# Patient Record
Sex: Male | Born: 1935 | Race: White | Hispanic: No | Marital: Married | Smoking: Former smoker
Health system: Southern US, Community
[De-identification: ages and names within clinical notes are randomized; demographics above are authoritative.]

## PROBLEM LIST (undated history)

## (undated) DIAGNOSIS — G620 Drug-induced polyneuropathy: Secondary | ICD-10-CM

## (undated) DIAGNOSIS — C169 Malignant neoplasm of stomach, unspecified: Secondary | ICD-10-CM

## (undated) DIAGNOSIS — K219 Gastro-esophageal reflux disease without esophagitis: Secondary | ICD-10-CM

## (undated) DIAGNOSIS — C44320 Squamous cell carcinoma of skin of unspecified parts of face: Secondary | ICD-10-CM

## (undated) DIAGNOSIS — F0280 Dementia in other diseases classified elsewhere without behavioral disturbance: Secondary | ICD-10-CM

## (undated) DIAGNOSIS — K635 Polyp of colon: Secondary | ICD-10-CM

## (undated) DIAGNOSIS — N400 Enlarged prostate without lower urinary tract symptoms: Secondary | ICD-10-CM

## (undated) DIAGNOSIS — M159 Polyosteoarthritis, unspecified: Secondary | ICD-10-CM

## (undated) DIAGNOSIS — G309 Alzheimer's disease, unspecified: Secondary | ICD-10-CM

## (undated) HISTORY — DX: Malignant neoplasm of stomach, unspecified: C16.9

## (undated) HISTORY — DX: Drug-induced polyneuropathy: G62.0

## (undated) HISTORY — DX: Polyp of colon: K63.5

## (undated) HISTORY — DX: Gastro-esophageal reflux disease without esophagitis: K21.9

## (undated) HISTORY — PX: CATARACT EXTRACTION, BILATERAL: SHX1313

## (undated) HISTORY — PX: HEMORRHOID SURGERY: SHX153

## (undated) HISTORY — PX: ROTATOR CUFF REPAIR: SHX139

## (undated) HISTORY — DX: Benign prostatic hyperplasia without lower urinary tract symptoms: N40.0

## (undated) HISTORY — DX: Squamous cell carcinoma of skin of unspecified parts of face: C44.320

## (undated) HISTORY — DX: Polyosteoarthritis, unspecified: M15.9

---

## 1997-10-15 DIAGNOSIS — C169 Malignant neoplasm of stomach, unspecified: Secondary | ICD-10-CM

## 1997-10-15 HISTORY — PX: PARTIAL GASTRECTOMY: SHX2172

## 1997-10-15 HISTORY — DX: Malignant neoplasm of stomach, unspecified: C16.9

## 2011-05-22 ENCOUNTER — Encounter: Payer: Self-pay | Admitting: Internal Medicine

## 2011-05-22 ENCOUNTER — Ambulatory Visit (INDEPENDENT_AMBULATORY_CARE_PROVIDER_SITE_OTHER): Payer: Medicare Other | Admitting: Internal Medicine

## 2011-05-22 DIAGNOSIS — G620 Drug-induced polyneuropathy: Secondary | ICD-10-CM

## 2011-05-22 DIAGNOSIS — D126 Benign neoplasm of colon, unspecified: Secondary | ICD-10-CM

## 2011-05-22 DIAGNOSIS — C169 Malignant neoplasm of stomach, unspecified: Secondary | ICD-10-CM

## 2011-05-22 DIAGNOSIS — R269 Unspecified abnormalities of gait and mobility: Secondary | ICD-10-CM

## 2011-05-22 DIAGNOSIS — K219 Gastro-esophageal reflux disease without esophagitis: Secondary | ICD-10-CM

## 2011-05-22 DIAGNOSIS — M159 Polyosteoarthritis, unspecified: Secondary | ICD-10-CM

## 2011-05-22 DIAGNOSIS — K635 Polyp of colon: Secondary | ICD-10-CM

## 2011-05-22 LAB — CBC WITH DIFFERENTIAL/PLATELET
Basophils Absolute: 0 10*3/uL (ref 0.0–0.1)
Eosinophils Absolute: 0.2 10*3/uL (ref 0.0–0.7)
HCT: 41.8 % (ref 39.0–52.0)
Hemoglobin: 13.9 g/dL (ref 13.0–17.0)
Lymphs Abs: 2.1 10*3/uL (ref 0.7–4.0)
MCHC: 33.3 g/dL (ref 30.0–36.0)
MCV: 91.9 fl (ref 78.0–100.0)
Monocytes Absolute: 0.9 10*3/uL (ref 0.1–1.0)
Neutro Abs: 2.9 10*3/uL (ref 1.4–7.7)
Platelets: 148 10*3/uL — ABNORMAL LOW (ref 150.0–400.0)
RDW: 14.2 % (ref 11.5–14.6)

## 2011-05-22 LAB — TSH: TSH: 0.96 u[IU]/mL (ref 0.35–5.50)

## 2011-05-22 LAB — HEPATIC FUNCTION PANEL
Albumin: 3.7 g/dL (ref 3.5–5.2)
Alkaline Phosphatase: 81 U/L (ref 39–117)
Total Protein: 7.3 g/dL (ref 6.0–8.3)

## 2011-05-22 LAB — BASIC METABOLIC PANEL
BUN: 26 mg/dL — ABNORMAL HIGH (ref 6–23)
CO2: 28 mEq/L (ref 19–32)
GFR: 69.24 mL/min (ref >60.00)
Glucose, Bld: 78 mg/dL (ref 70–99)
Potassium: 4.9 mEq/L (ref 3.5–5.1)
Sodium: 141 mEq/L (ref 135–145)

## 2011-05-22 MED ORDER — SUCRALFATE 1 GM/10ML PO SUSP: 1.0000 g | Freq: Two times a day (BID) | ORAL | Status: DC

## 2011-05-22 NOTE — Assessment & Plan Note (Signed)
Mostly in shoulders Only painful if he has to work above his head No meds

## 2011-05-22 NOTE — Progress Notes (Signed)
Subjective:    Patient ID: Corey Clark, male    DOB: [birth year], 77 y.o.   MRN: [id number]  HPI New resident at [facility] --moved from [place] several weeks ago Establishing here  Has imbalance problem since stomach cancer surgery ~13 years ago Got chemo then also Has to be careful when moving --doesn't feel dizzy though Has some numbness in feet Has had occ fall---last was on broken step at granddaughter's house No vertigo  Has polyps in colon Last colonoscopy 2010  Takes carafate for some time (a couple of years) Doesn't seem to have active reflux now Had indigestion which the carafate resolved  Has chronic shoulder pain Still with soreness Rest of body mostly okay  No current outpatient prescriptions on file prior to visit.    Allergies  Allergen Reactions  . Tetracyclines & Related Other (See Comments)    Patient not sure it's been years since he's taken med    Past Medical History  Diagnosis Date  . GERD (gastroesophageal reflux disease)   . Colon polyps   . Osteoarthrosis involving, or with mention of more than one site, but not specified as generalized, multiple sites   . Neuropathy due to drug     chemo for stomach cancer  . Stomach cancer 1999    surgery then chemo. Bad infection after with 1 month hospitalization    Past Surgical History  Procedure Date  . Partial gastrectomy 1999    2 procedures for stomach cancer  . Rotator cuff repair 1991/1998    left and right done  . Hemorrhoid surgery 1998 & 2000  . Cataract extraction, bilateral 2002, 2003    Family History  Problem Relation Age of Onset  . Cancer Brother   . Heart disease Neg Hx   . Hypertension Neg Hx   . Diabetes Neg Hx     History   Social History  . Marital Status: Married    Spouse Name: N/A    Number of Children: 3  . Years of Education: N/A   Occupational History  . 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 Smoking  status: Former Smoker    Quit date: 10/15/1950  . Smokeless tobacco: Never Used  . Alcohol Use: Yes     rare wine  . Drug Use: No  . Sexually Active: Not on file   Other Topics Concern  . Not on file   Social History Narrative   [place], then son, Jayde Daffin., to be health care POAWould accept resuscitation attemptsNot sure about tube feeds   Review of Systems  Constitutional: Negative for fatigue and unexpected weight change.       Wears seat belt  HENT: Positive for hearing loss and tinnitus.        Hearing aide on right Own teeth, regular with dentist  Eyes: Negative for visual disturbance.       Vision is good  Respiratory: Negative for cough, chest tightness and shortness of breath.   Cardiovascular: Negative for chest pain, palpitations and leg swelling.  Gastrointestinal: Negative for nausea, vomiting, constipation and blood in stool.       No stomach trouble on the carafate  Genitourinary: Negative for dysuria, frequency and difficulty urinating.  Musculoskeletal: Positive for arthralgias. Negative for back pain and joint swelling.       Chronic shoulder pain  Skin:  Sees dermatologist for occ lesions (?actinics)  Neurological: Positive for numbness. Negative for syncope and weakness.  Psychiatric/Behavioral: Negative for sleep disturbance and dysphoric mood. The patient is not nervous/anxious.        Objective:   Physical Exam  Constitutional: He appears well-developed and well-nourished. No distress.  HENT:  Mouth/Throat: Oropharynx is clear and moist. No oropharyngeal exudate.  Eyes: Conjunctivae and EOM are normal. Pupils are equal, round, and reactive to light.  Neck: Normal range of motion. Neck supple. No thyromegaly present.  Cardiovascular: Normal rate, regular rhythm, normal heart sounds and intact distal pulses.  Exam reveals no gallop.   No murmur heard. Pulmonary/Chest: Effort normal. No respiratory  distress. He has no wheezes. He has no rales.  Abdominal: Soft. There is no tenderness.  Musculoskeletal: Normal range of motion. He exhibits no edema and no tenderness.  Lymphadenopathy:    He has no cervical adenopathy.  Neurological:       Mild decreased fine touch sensation R>L plantar feet  Skin: No rash noted.       No suspicious lesions  Psychiatric: He has a normal mood and affect. His behavior is normal. Judgment and thought content normal.          Assessment & Plan:

## 2011-05-22 NOTE — Assessment & Plan Note (Signed)
Well controlled on the carafate  Will continue

## 2011-05-22 NOTE — Assessment & Plan Note (Signed)
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

## 2011-05-22 NOTE — Assessment & Plan Note (Signed)
Treated 13 years ago No evidence of recurrence apparently

## 2011-05-22 NOTE — Assessment & Plan Note (Signed)
Will check records

## 2011-05-22 NOTE — Patient Instructions (Signed)
Please get records from [facility] doctor for past 3 years

## 2011-06-01 ENCOUNTER — Encounter: Payer: Self-pay | Admitting: Internal Medicine

## 2011-10-26 DIAGNOSIS — J060 Acute laryngopharyngitis: Secondary | ICD-10-CM | POA: Diagnosis not present

## 2011-10-26 DIAGNOSIS — B351 Tinea unguium: Secondary | ICD-10-CM | POA: Diagnosis not present

## 2011-10-26 DIAGNOSIS — M79609 Pain in unspecified limb: Secondary | ICD-10-CM | POA: Diagnosis not present

## 2011-10-26 DIAGNOSIS — R1310 Dysphagia, unspecified: Secondary | ICD-10-CM | POA: Diagnosis not present

## 2011-11-23 ENCOUNTER — Encounter: Payer: Self-pay | Admitting: Internal Medicine

## 2011-11-26 ENCOUNTER — Ambulatory Visit (INDEPENDENT_AMBULATORY_CARE_PROVIDER_SITE_OTHER): Payer: Medicare Other | Admitting: Internal Medicine

## 2011-11-26 ENCOUNTER — Encounter: Payer: Self-pay | Admitting: Internal Medicine

## 2011-11-26 DIAGNOSIS — T50904A Poisoning by unspecified drugs, medicaments and biological substances, undetermined, initial encounter: Secondary | ICD-10-CM

## 2011-11-26 DIAGNOSIS — K219 Gastro-esophageal reflux disease without esophagitis: Secondary | ICD-10-CM | POA: Diagnosis not present

## 2011-11-26 DIAGNOSIS — M159 Polyosteoarthritis, unspecified: Secondary | ICD-10-CM

## 2011-11-26 DIAGNOSIS — G620 Drug-induced polyneuropathy: Secondary | ICD-10-CM

## 2011-11-26 DIAGNOSIS — K5909 Other constipation: Secondary | ICD-10-CM

## 2011-11-26 NOTE — Progress Notes (Signed)
Subjective:    Patient ID: Corey Clark, male    DOB: 10-Nov-1924, 78 y.o.   MRN: [id number]  HPI Here with wife No new concerns  Wonders about repeating colonoscopy Intermittent gas, indigestion and constipation Still on the carafate bid Stool will get hard---may skip a day. Hasn't used meds for this No blood in stools No nausea or vomiting  Appetite is fine Weight is stable  No sugar free candy or drinks regularly Does eat dairy---no prior problems with this  Still with numbness in toes Walks okay --still notes chronic imbalance Discussed using walking stick  No sig arthritis problems--except shoulders Fine if he limits above shoulder work Still doesn't need meds  Current Outpatient Prescriptions on File Prior to Visit  Medication Sig Dispense Refill  . Multiple Vitamin (MULTIVITAMIN) tablet Take 1 tablet by mouth daily.        . sucralfate (CARAFATE) 1 GM/10ML suspension Take 10 mLs (1 g total) by mouth 2 (two) times daily.  1680 mL  3    Allergies  Allergen Reactions  . Tetracyclines & Related Other (See Comments)    Patient not sure it's been years since he's taken med    Past Medical History  Diagnosis Date  . GERD (gastroesophageal reflux disease)   . Colon polyps   . Osteoarthrosis involving, or with mention of more than one site, but not specified as generalized, multiple sites   . Neuropathy due to drug     chemo for stomach cancer  . Stomach cancer 1999    MALT lymphoma --surgery then chemo. Bad infection after with 1 month hospitalization    Past Surgical History  Procedure Date  . Partial gastrectomy 1999    2 procedures for stomach cancer  . Rotator cuff repair 1991/1998    left and right done  . Hemorrhoid surgery 1998 & 2000  . Cataract extraction, bilateral 2002, 2003    Family History  Problem Relation Age of Onset  . Cancer Brother   . Heart disease Neg Hx   . Hypertension Neg Hx   . Diabetes Neg Hx     History   Social  History  . Marital Status: Married    Spouse Name: N/A    Number of Children: 3  . Years of Education: N/A   Occupational History  . 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 Smoking status: Former Smoker    Quit date: 10/15/1950  . Smokeless tobacco: Never Used  . Alcohol Use: Yes     rare wine  . Drug Use: No  . Sexually Active: Not on file   Other Topics Concern  . Not on file   Social History Narrative   3 [place] living [place] Wife, then son, Enoch Moffa., to be health care POAWould accept resuscitation attemptsNot sure about tube feeds   Review of Systems Sleeps okay No chest pain No SOB    Objective:   Physical Exam  Constitutional: He appears well-developed and well-nourished. No distress.  Neck: Normal range of motion. Neck supple.  Cardiovascular: Normal rate, regular rhythm and normal heart sounds.  Exam reveals no gallop.   No murmur heard. Pulmonary/Chest: Effort normal and breath sounds normal. No respiratory distress. He has no wheezes. He has no rales.  Abdominal: Soft. Bowel sounds are normal. He exhibits no distension and no mass. There is no tenderness. There is no rebound and no guarding.  Musculoskeletal: He exhibits no edema and no tenderness.  Lymphadenopathy:    He has no cervical adenopathy.  Psychiatric: He has a normal mood and affect. His behavior is normal. Judgment and thought content normal.          Assessment & Plan:

## 2011-11-26 NOTE — Assessment & Plan Note (Signed)
From chemo Discussed need for cane or walking stick--esp if on non level ground

## 2011-11-26 NOTE — Assessment & Plan Note (Signed)
Not clear that this is cause of his indigestion Discussed meds for this, decreased dairy, etc

## 2011-11-26 NOTE — Patient Instructions (Addendum)
You can try senekot-S 1-2 daily or miralax 17 grams daily if you are constipated You may want to try lactaid milk instead of regular and decrease your cheese for 1-2 weeks to see if that helps the indigestion

## 2011-11-26 NOTE — Assessment & Plan Note (Signed)
Mostly shoulders Does okay without meds

## 2011-11-26 NOTE — Assessment & Plan Note (Signed)
May be part of his symptoms Continue the carafate

## 2011-12-05 DIAGNOSIS — L819 Disorder of pigmentation, unspecified: Secondary | ICD-10-CM | POA: Diagnosis not present

## 2011-12-05 DIAGNOSIS — L570 Actinic keratosis: Secondary | ICD-10-CM | POA: Diagnosis not present

## 2011-12-31 ENCOUNTER — Ambulatory Visit (INDEPENDENT_AMBULATORY_CARE_PROVIDER_SITE_OTHER): Payer: Medicare Other | Admitting: Family Medicine

## 2011-12-31 ENCOUNTER — Encounter: Payer: Self-pay | Admitting: Family Medicine

## 2011-12-31 VITALS — BP 122/62 | HR 76 | Temp 97.5°F | Wt 190.0 lb

## 2011-12-31 DIAGNOSIS — M79609 Pain in unspecified limb: Secondary | ICD-10-CM | POA: Diagnosis not present

## 2011-12-31 DIAGNOSIS — M79645 Pain in left finger(s): Secondary | ICD-10-CM

## 2011-12-31 DIAGNOSIS — M6530 Trigger finger, unspecified finger: Secondary | ICD-10-CM | POA: Insufficient documentation

## 2011-12-31 NOTE — Progress Notes (Signed)
  Patient Name: Corey Clark Date of Birth: March 27, 1925 Age: 78 y.o. Medical Record Number: [id number] Gender: male Date of Encounter: 12/31/2011  History of Present Illness:  Corey Clark is a 78 y.o. very pleasant male patient who presents with the following:  Pleasant gentleman with left-sided hand pain I am asked to see acutely in the office today by Dr. Dayton Martes. He is friendly, and has been having some sticking when he is flexing his left hand mostly on the fourth digit. No trauma or accident. No bruising. No significant swelling, but he does have a painful nodule   Past Medical History, Surgical History, Social History, Family History, Problem List, Medications, and Allergies have been reviewed and updated if relevant.  Review of Systems:  GEN: No fevers, chills. Nontoxic. Primarily MSK c/o today. MSK: Detailed in the HPI GI: tolerating PO intake without difficulty Neuro: No numbness, parasthesias, or tingling associated. Otherwise the pertinent positives of the ROS are noted above.    Physical Examination: Temp 98.6 Pulse 75 RR 18   GEN: WDWN, NAD, Non-toxic, Alert & Oriented x 3 HEENT: Atraumatic, Normocephalic.  Ears and Nose: No external deformity. EXTR: No clubbing/cyanosis/edema NEURO: Normal gait.  PSYCH: Normally interactive. Conversant. Not depressed or anxious appearing.  Calm demeanor.   L hand Ecchymosis or edema: neg ROM wrist/hand/digits: full  Ecchymosis or edema: neg No instability Cysts/nodules: 4th volar Digit triggering: 4th Finkelstein's test: neg Snuffbox tenderness: neg Scaphoid tubercle: NT Resisted supination: NT Full composite fist, no malrotation Grip, all digits: 5/5 str DIPJT: NT PIP JT: NT MCP JT: NT Atrophy: neg  Hand sensation: intact   Assessment and Plan: 1. Finger pain, left   2. Trigger finger, left      Using an anatomical model, I reviewed with the patent the structures involved and how they related to  their diagnosis .  We discussed the pathophysiology of trigger fingers. Discussed the inflammatory nature of nodule creation and likely nodule abutting the A1 pulley system, this causing the patient's discomfort and sensations. We discussed that treatments for this include direct injection into the tendon sheath to attempt to shrink catching tissue. This can be done 1-2 times. Other treatments include surgical release. If the patient fails to trigger finger injections, I would recommend trigger finger release if the patient desires relief of the symptoms.  Trigger Finger Injection, L 4th Verbal consent was obtained. Risks (including rare risk of infection, potential risk for skin lightening and potential atrophy), benefits and alternatives were discussed. Prepped with Chloraprep and Ethyl Chloride used for anesthesia. Under sterile conditions, patient injected at palmar crease aiming distally with 45 degree angle towards nodule; injected directly into tendon sheath. Medication flowed freely without resistance.  Needle size: 22 gauge 1 1/2 inch Injection: 1/2 cc of Lidocaine 1% and 1/2 cc of Depo-Medrol 40 mg

## 2011-12-31 NOTE — Progress Notes (Signed)
  Subjective:    Patient ID: Corey Clark, male    DOB: 1925/01/14, 78 y.o.   MRN: [id number]  HPI  78 yo pt of Dr. Alphonsus Sias with h/o OA here for:  Acute onset of pain in left fourth digit last week. Pain is relieved by making a fist or rubbing over palm of his hand. Never had anything like this before.  Can feel a "snap" over palm.    Review of Systems     Objective:   Physical Exam BP 122/62  Pulse 76  Temp(Src) 97.5 F (36.4 C) (Oral)  Wt 190 lb (86.183 kg)      Assessment & Plan:   1. Trigger finger

## 2012-01-25 DIAGNOSIS — M79609 Pain in unspecified limb: Secondary | ICD-10-CM | POA: Diagnosis not present

## 2012-01-25 DIAGNOSIS — B351 Tinea unguium: Secondary | ICD-10-CM | POA: Diagnosis not present

## 2012-02-07 DIAGNOSIS — L570 Actinic keratosis: Secondary | ICD-10-CM | POA: Diagnosis not present

## 2012-02-07 DIAGNOSIS — C44611 Basal cell carcinoma of skin of unspecified upper limb, including shoulder: Secondary | ICD-10-CM | POA: Diagnosis not present

## 2012-02-07 DIAGNOSIS — D485 Neoplasm of uncertain behavior of skin: Secondary | ICD-10-CM | POA: Diagnosis not present

## 2012-02-21 DIAGNOSIS — C44601 Unspecified malignant neoplasm of skin of unspecified upper limb, including shoulder: Secondary | ICD-10-CM | POA: Diagnosis not present

## 2012-02-21 DIAGNOSIS — C44611 Basal cell carcinoma of skin of unspecified upper limb, including shoulder: Secondary | ICD-10-CM | POA: Diagnosis not present

## 2012-04-02 ENCOUNTER — Telehealth: Payer: Self-pay

## 2012-04-02 NOTE — Telephone Encounter (Signed)
Mandy with [facility] said pt had skin CA removal from lt forearm 6 weeks ago. External stitches removed; dissolvable internal stitches are trying to come out of skin at suture line. No drainage or redness noted.Pt has [facility] and would like appt with Dr Alphonsus Sias. Appt scheduled 04/03/12 at 1:45pm after speaking with [facility].

## 2012-04-03 ENCOUNTER — Encounter: Payer: Self-pay | Admitting: Internal Medicine

## 2012-04-03 ENCOUNTER — Ambulatory Visit (INDEPENDENT_AMBULATORY_CARE_PROVIDER_SITE_OTHER): Payer: Medicare Other | Admitting: Internal Medicine

## 2012-04-03 VITALS — BP 120/62 | HR 64 | Temp 97.6°F | Wt 192.0 lb

## 2012-04-03 DIAGNOSIS — L089 Local infection of the skin and subcutaneous tissue, unspecified: Secondary | ICD-10-CM

## 2012-04-03 NOTE — Assessment & Plan Note (Signed)
Surgical site is not infected Some sutures which have not dissolved yet Discussed warm compresses If stitch appears, can try to gently pull out Patience is needed now

## 2012-04-03 NOTE — Progress Notes (Signed)
  Subjective:    Patient ID: Corey Clark, male    DOB: [birth year], 78 y.o.   MRN: [id number]  HPI Saw Dr Purcell Nails for regular check up Suspicious lesion on left forearm biopsied---basal cell Came back for excision May 9th Stitches removed May 17th and steri-strips applied  Still sees some absorbable sutures Slight festering and bumps along suture line No sig pain now---did have some soreness at times  Current Outpatient Prescriptions on File Prior to Visit  Medication Sig Dispense Refill  . Multiple Vitamin (MULTIVITAMIN) tablet Take 1 tablet by mouth daily.        . sucralfate (CARAFATE) 1 GM/10ML suspension Take 10 mLs (1 g total) by mouth 2 (two) times daily.  1680 mL  3    Allergies  Allergen Reactions  . Tetracyclines & Related Other (See Comments)    Patient not sure it's been years since he's taken med    Past Medical History  Diagnosis Date  . GERD (gastroesophageal reflux disease)   . Colon polyps   . Osteoarthrosis involving, or with mention of more than one site, but not specified as generalized, multiple sites   . Neuropathy due to drug     chemo for stomach cancer  . Stomach cancer 1999    MALT lymphoma --surgery then chemo. Bad infection after with 1 month hospitalization    Past Surgical History  Procedure Date  . Partial gastrectomy 1999    2 procedures for stomach cancer  . Rotator cuff repair 1991/1998    left and right done  . Hemorrhoid surgery 1998 & 2000  . Cataract extraction, bilateral 2002, 2003    Family History  Problem Relation Age of Onset  . Cancer Brother   . Heart disease Neg Hx   . Hypertension Neg Hx   . Diabetes Neg Hx     History   Social History  . Marital Status: Married    Spouse Name: N/A    Number of Children: 3  . Years of Education: N/A   Occupational History  . 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 Smoking status: Former Smoker    Quit date: 10/15/1950  .  Smokeless tobacco: Never Used  . Alcohol Use: Yes     rare wine  . Drug Use: No  . Sexually Active: Not on file   Other Topics Concern  . Not on file   Social History Narrative   3 [place] living [place] Wife, then son, Aras Albarran., to be health care POAWould accept resuscitation attemptsNot sure about tube feeds   Review of Systems No fever Feels okay    Objective:   Physical Exam  Constitutional: He appears well-developed and well-nourished. No distress.  Skin:       Left forearm excision spot is well healed Mild erythema without warmth or significant tenderness along site Several firm spots along scar which seem like undissolved sutures          Assessment & Plan:

## 2012-04-25 DIAGNOSIS — L600 Ingrowing nail: Secondary | ICD-10-CM | POA: Diagnosis not present

## 2012-04-25 DIAGNOSIS — B351 Tinea unguium: Secondary | ICD-10-CM | POA: Diagnosis not present

## 2012-04-30 DIAGNOSIS — L719 Rosacea, unspecified: Secondary | ICD-10-CM | POA: Diagnosis not present

## 2012-05-30 ENCOUNTER — Encounter: Payer: Self-pay | Admitting: Internal Medicine

## 2012-05-30 ENCOUNTER — Ambulatory Visit (INDEPENDENT_AMBULATORY_CARE_PROVIDER_SITE_OTHER): Payer: Medicare Other | Admitting: Internal Medicine

## 2012-05-30 VITALS — BP 112/60 | HR 58 | Temp 98.1°F | Ht 70.0 in | Wt 190.0 lb

## 2012-05-30 DIAGNOSIS — T50904A Poisoning by unspecified drugs, medicaments and biological substances, undetermined, initial encounter: Secondary | ICD-10-CM | POA: Diagnosis not present

## 2012-05-30 DIAGNOSIS — M159 Polyosteoarthritis, unspecified: Secondary | ICD-10-CM | POA: Diagnosis not present

## 2012-05-30 DIAGNOSIS — Z0000 Encounter for general adult medical examination without abnormal findings: Secondary | ICD-10-CM | POA: Diagnosis not present

## 2012-05-30 DIAGNOSIS — G620 Drug-induced polyneuropathy: Secondary | ICD-10-CM | POA: Diagnosis not present

## 2012-05-30 DIAGNOSIS — R413 Other amnesia: Secondary | ICD-10-CM

## 2012-05-30 DIAGNOSIS — K219 Gastro-esophageal reflux disease without esophagitis: Secondary | ICD-10-CM

## 2012-05-30 LAB — HEPATIC FUNCTION PANEL
ALT: 16 U/L (ref 0–53)
AST: 34 U/L (ref 0–37)
Alkaline Phosphatase: 73 U/L (ref 39–117)
Bilirubin, Direct: 0.1 mg/dL (ref 0.0–0.3)
Total Protein: 7.3 g/dL (ref 6.0–8.3)

## 2012-05-30 LAB — CBC WITH DIFFERENTIAL/PLATELET
Basophils Relative: 0.6 % (ref 0.0–3.0)
Eosinophils Absolute: 0.2 10*3/uL (ref 0.0–0.7)
Eosinophils Relative: 2.9 % (ref 0.0–5.0)
Hemoglobin: 14 g/dL (ref 13.0–17.0)
Lymphocytes Relative: 39.3 % (ref 12.0–46.0)
MCHC: 33.1 g/dL (ref 30.0–36.0)
Neutro Abs: 2.5 10*3/uL (ref 1.4–7.7)
RBC: 4.58 Mil/uL (ref 4.22–5.81)

## 2012-05-30 LAB — SEDIMENTATION RATE: Sed Rate: 19 mm/hr (ref 0–22)

## 2012-05-30 LAB — BASIC METABOLIC PANEL
CO2: 29 mEq/L (ref 19–32)
Calcium: 8.9 mg/dL (ref 8.4–10.5)
Chloride: 104 mEq/L (ref 96–112)
Sodium: 139 mEq/L (ref 135–145)

## 2012-05-30 NOTE — Assessment & Plan Note (Signed)
I have personally reviewed the Medicare Annual Wellness questionnaire and have noted 1. The patient's medical and social history 2. Their use of alcohol, tobacco or illicit drugs 3. Their current medications and supplements 4. The patient's functional ability including ADL's, fall risks, home safety risks and hearing or visual             impairment. 5. Diet and physical activities 6. Evidence for depression or mood disorders  The patients weight, height, BMI and visual acuity have been recorded in the chart I have made referrals, counseling and provided education to the patient based review of the above and I have provided the pt with a written personalized care plan for preventive services.  I have provided you with a copy of your personalized plan for preventive services. Please take the time to review along with your updated medication list.  Cognitive issues are only concern--will follow up on this

## 2012-05-30 NOTE — Assessment & Plan Note (Signed)
Pattern most consistent with MCI but will do Montreal test at follow up Check labs Wife worried about NPH but he doesn't have ataxia or incontinence

## 2012-05-30 NOTE — Progress Notes (Signed)
Subjective:    Patient ID: Corey Clark, male    DOB: 1925-04-16, 78 y.o.   MRN: [id number]  HPI Here for Medicare Wellness visit and check up Arm lesion did heal up---may still have undissolved stitch there Reviewed advanced directives Had fall when going into building for movie--tripped on curb. Hit face and had slight skinned area on nose. He has chronic imbalance problems Occ down day (if wife is out) but no anhedonia UTD on immunizations Had colonoscopy and PSA not appropriate Mild memory problems  Stomach has been fine No heartburn issues Continues on the sucralfate  Balance problems conitinue Discussed cane Toes are still numb Wife heard about association of this and NPH---not clear about that  No major arthritis problems CMC pain on left, some shoulder problems No meds  Current Outpatient Prescriptions on File Prior to Visit  Medication Sig Dispense Refill  . Multiple Vitamin (MULTIVITAMIN) tablet Take 1 tablet by mouth daily.        . sucralfate (CARAFATE) 1 GM/10ML suspension Take 10 mLs (1 g total) by mouth 2 (two) times daily.  1680 mL  3    Allergies  Allergen Reactions  . Tetracyclines & Related Other (See Comments)    Patient not sure it's been years since he's taken med    Past Medical History  Diagnosis Date  . GERD (gastroesophageal reflux disease)   . Colon polyps   . Osteoarthrosis involving, or with mention of more than one site, but not specified as generalized, multiple sites   . Neuropathy due to drug     chemo for stomach cancer  . Stomach cancer 1999    MALT lymphoma --surgery then chemo. Bad infection after with 1 month hospitalization    Past Surgical History  Procedure Date  . Partial gastrectomy 1999    2 procedures for stomach cancer  . Rotator cuff repair 1991/1998    left and right done  . Hemorrhoid surgery 1998 & 2000  . Cataract extraction, bilateral 2002, 2003    Family History  Problem Relation Age of Onset  .  Cancer Brother   . Heart disease Neg Hx   . Hypertension Neg Hx   . Diabetes Neg Hx     History   Social History  . Marital Status: Married    Spouse Name: N/A    Number of Children: 3  . Years of Education: N/A   Occupational History  . 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 Smoking status: Former Smoker    Quit date: 10/15/1950  . Smokeless tobacco: Never Used  . Alcohol Use: Yes     rare wine  . Drug Use: No  . Sexually Active: Not on file   Other Topics Concern  . Not on file   Social History Narrative   3 [place] living [place] Wife, then son, Noa Constante., to be health care POAWould accept resuscitation attemptsNot sure about tube feeds   Review of Systems Sleeps fine---occ takes nap after eating Appetite is good Weight stable     Objective:   Physical Exam  Constitutional: He is oriented to person, place, and time. He appears well-developed and well-nourished. No distress.  Neck: Normal range of motion. Neck supple. No thyromegaly present.  Cardiovascular: Normal rate, regular rhythm and normal heart sounds.  Exam reveals no gallop.   No murmur heard. Pulmonary/Chest: Effort normal and breath sounds normal. No respiratory distress.  He has no wheezes. He has no rales.  Abdominal: Soft. There is no tenderness.  Musculoskeletal: He exhibits no edema and no tenderness.  Lymphadenopathy:    He has no cervical adenopathy.  Neurological: He is alert and oriented to person, place, and time.       President-- "Barack Obama, ?" [phone] [id number] Recall 3/3  Gait fairly normal Romberg absent  Psychiatric: He has a normal mood and affect. His behavior is normal. Thought content normal.          Assessment & Plan:

## 2012-05-30 NOTE — Assessment & Plan Note (Signed)
Controlled with the carafate

## 2012-05-30 NOTE — Assessment & Plan Note (Signed)
Discussed using cane

## 2012-05-30 NOTE — Assessment & Plan Note (Signed)
Mild symptoms Generally doesn't take meds 

## 2012-06-02 LAB — VITAMIN B12: Vitamin B-12: 363 pg/mL (ref 211–911)

## 2012-06-03 ENCOUNTER — Encounter: Payer: Self-pay | Admitting: *Deleted

## 2012-06-30 ENCOUNTER — Encounter: Payer: Self-pay | Admitting: Internal Medicine

## 2012-06-30 ENCOUNTER — Ambulatory Visit (INDEPENDENT_AMBULATORY_CARE_PROVIDER_SITE_OTHER): Payer: Medicare Other | Admitting: Internal Medicine

## 2012-06-30 VITALS — BP 120/68 | HR 64 | Wt 187.0 lb

## 2012-06-30 DIAGNOSIS — G3184 Mild cognitive impairment, so stated: Secondary | ICD-10-CM

## 2012-06-30 DIAGNOSIS — E875 Hyperkalemia: Secondary | ICD-10-CM

## 2012-06-30 NOTE — Assessment & Plan Note (Signed)
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

## 2012-06-30 NOTE — Progress Notes (Signed)
  Subjective:    Patient ID: Corey Clark, male    DOB: Oct 08, 1925, 78 y.o.   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 METROGEL 1 % gel Apply 1 application topically daily.       . Multiple Vitamin (MULTIVITAMIN) tablet Take 1 tablet by mouth daily.        . sucralfate (CARAFATE) 1 GM/10ML suspension Take 10 mLs (1 g total) by mouth 2 (two) times daily.  1680 mL  3  . Sulfacetamide Sodium-Sulfur 10-5 % EMUL Apply 1 application topically daily.         Allergies  Allergen Reactions  . Tetracyclines & Related Other (See Comments)    Patient not sure it's been years since he's taken med    Past Medical History  Diagnosis Date  . GERD (gastroesophageal reflux disease)   . Colon polyps   . Osteoarthrosis involving, or with mention of more than one site, but not specified as generalized, multiple sites   . Neuropathy due to drug     chemo for stomach cancer  . Stomach cancer 1999    MALT lymphoma --surgery then chemo. Bad infection after with 1 month hospitalization    Past Surgical History  Procedure Date  . Partial gastrectomy 1999    2 procedures for stomach cancer  . Rotator cuff repair 1991/1998    left and right done  . Hemorrhoid surgery 1998 & 2000  . Cataract extraction, bilateral 2002, 2003    Family History  Problem Relation Age of Onset  . Cancer Brother   . Heart disease Neg Hx   . Hypertension Neg Hx   . Diabetes Neg Hx     History   Social History  . Marital Status: Married    Spouse Name: N/A      Number of Children: 3  . Years of Education: N/A   Occupational History  . 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 Smoking status: Former Smoker    Quit date: 10/15/1950  . Smokeless tobacco: Never Used  . Alcohol Use: Yes     rare wine  . Drug Use: No  . Sexually Active: Not on file   Other Topics Concern  . Not on file   Social History Narrative   [place] Wife, then son, Corey Clark., to be health care POAWould accept resuscitation attemptsNot sure about tube feeds   Review of Systems No incontinence He does the cooking---appetite is fine    Objective:   Physical Exam  Neurological:       See [facility] Cognitive Assessment form          Assessment & Plan:

## 2012-07-01 ENCOUNTER — Ambulatory Visit: Payer: Self-pay | Admitting: Internal Medicine

## 2012-07-01 ENCOUNTER — Encounter: Payer: Self-pay | Admitting: *Deleted

## 2012-07-01 DIAGNOSIS — G3184 Mild cognitive impairment, so stated: Secondary | ICD-10-CM | POA: Diagnosis not present

## 2012-07-01 DIAGNOSIS — R413 Other amnesia: Secondary | ICD-10-CM | POA: Diagnosis not present

## 2012-07-02 ENCOUNTER — Encounter: Payer: Self-pay | Admitting: Internal Medicine

## 2012-07-02 DIAGNOSIS — L821 Other seborrheic keratosis: Secondary | ICD-10-CM | POA: Diagnosis not present

## 2012-07-02 DIAGNOSIS — L719 Rosacea, unspecified: Secondary | ICD-10-CM | POA: Diagnosis not present

## 2012-07-02 DIAGNOSIS — L819 Disorder of pigmentation, unspecified: Secondary | ICD-10-CM | POA: Diagnosis not present

## 2012-07-02 DIAGNOSIS — Z85828 Personal history of other malignant neoplasm of skin: Secondary | ICD-10-CM | POA: Diagnosis not present

## 2012-07-02 DIAGNOSIS — L820 Inflamed seborrheic keratosis: Secondary | ICD-10-CM | POA: Diagnosis not present

## 2012-07-02 DIAGNOSIS — L570 Actinic keratosis: Secondary | ICD-10-CM | POA: Diagnosis not present

## 2012-07-03 ENCOUNTER — Telehealth: Payer: Self-pay

## 2012-07-03 NOTE — Telephone Encounter (Signed)
No restriction on bananas  If they want to try a medication for the mild cognitive impairment or early dementia, can send Rx for donepezil 5mg  daily #30 x 3 Set up appt in about 2 months to review how he is doing on it Most common problem is stomach or intestinal issues--can stop if any trouble with it

## 2012-07-03 NOTE — Telephone Encounter (Signed)
Pt's wife left v/m is there a med that pt can take for early stage of dementia? Also how often can pt eat bananas(important to pt). Pt's wife did not leave pharmacy info and I left v/m for pt to call back.

## 2012-07-04 MED ORDER — DONEPEZIL HCL 5 MG PO TABS: 5.0000 mg | ORAL_TABLET | Freq: Every evening | ORAL | Status: DC | PRN

## 2012-07-04 NOTE — Telephone Encounter (Signed)
.  left message to have patient return my call.  

## 2012-07-04 NOTE — Telephone Encounter (Signed)
pts wife left v/m returning call; call back [phone].

## 2012-07-04 NOTE — Telephone Encounter (Signed)
Spoke with patient's wife and advised results rx sent to pharmacy by e-script  

## 2012-07-11 DIAGNOSIS — B351 Tinea unguium: Secondary | ICD-10-CM | POA: Diagnosis not present

## 2012-07-11 DIAGNOSIS — M79609 Pain in unspecified limb: Secondary | ICD-10-CM | POA: Diagnosis not present

## 2012-07-14 ENCOUNTER — Other Ambulatory Visit: Payer: Self-pay | Admitting: Internal Medicine

## 2012-07-14 ENCOUNTER — Other Ambulatory Visit: Payer: Self-pay | Admitting: *Deleted

## 2012-07-14 MED ORDER — SUCRALFATE 1 GM/10ML PO SUSP: 1.0000 g | Freq: Two times a day (BID) | ORAL | Status: DC

## 2012-08-12 DIAGNOSIS — Z23 Encounter for immunization: Secondary | ICD-10-CM | POA: Diagnosis not present

## 2012-09-08 ENCOUNTER — Encounter: Payer: Self-pay | Admitting: Internal Medicine

## 2012-09-08 ENCOUNTER — Ambulatory Visit (INDEPENDENT_AMBULATORY_CARE_PROVIDER_SITE_OTHER): Payer: Medicare Other | Admitting: Internal Medicine

## 2012-09-08 VITALS — BP 102/60 | HR 67 | Temp 98.2°F | Wt 186.0 lb

## 2012-09-08 DIAGNOSIS — G3184 Mild cognitive impairment, so stated: Secondary | ICD-10-CM

## 2012-09-08 MED ORDER — DONEPEZIL HCL 10 MG PO TABS: 10.0000 mg | ORAL_TABLET | Freq: Every evening | ORAL | Status: DC | PRN

## 2012-09-08 NOTE — Assessment & Plan Note (Signed)
Seems to have had at least a mild response to the donepezil No side effects Will increase to 10mg 

## 2012-09-08 NOTE — Progress Notes (Signed)
  Subjective:    Patient ID: Corey Clark, male    DOB: June 15, 1925, 78 y.o.   MRN: [id number]  HPI Here with wife  Has been on the donepezil for about 2 months He doesn't note much difference Wife notes that he doesn't ask the same things over and over as much Wife also has slate and she puts the daily schedule on it  Wife worries about depression Overall, not as upset about his dementia issues as wife is but ?some mood issues He doesn't endorse ongoing mood issues---just reactive things (Iike upset till poorly installed shelves were fixed) Not anhedonic---games, trips, walking  No apparent side effects with the med No stomach upset Bowels and appetite are fine  Current Outpatient Prescriptions on File Prior to Visit  Medication Sig Dispense Refill  . donepezil (ARICEPT) 5 MG tablet Take 1 tablet (5 mg total) by mouth at bedtime as needed.  30 tablet  3  . Multiple Vitamin (MULTIVITAMIN) tablet Take 1 tablet by mouth daily.        . sucralfate (CARAFATE) 1 GM/10ML suspension Take 10 mLs (1 g total) by mouth 2 (two) times daily.  1680 mL  3    Allergies  Allergen Reactions  . Tetracyclines & Related Other (See Comments)    Patient not sure it's been years since he's taken med    Past Medical History  Diagnosis Date  . GERD (gastroesophageal reflux disease)   . Colon polyps   . Osteoarthrosis involving, or with mention of more than one site, but not specified as generalized, multiple sites   . Neuropathy due to drug     chemo for stomach cancer  . Stomach cancer 1999    MALT lymphoma --surgery then chemo. Bad infection after with 1 month hospitalization    Past Surgical History  Procedure Date  . Partial gastrectomy 1999    2 procedures for stomach cancer  . Rotator cuff repair 1991/1998    left and right done  . Hemorrhoid surgery 1998 & 2000  . Cataract extraction, bilateral 2002, 2003    Family History  Problem Relation Age of Onset  . Cancer Brother     . Heart disease Neg Hx   . Hypertension Neg Hx   . Diabetes Neg Hx     History   Social History  . Marital Status: Married    Spouse Name: N/A    Number of Children: 3  . Years of Education: N/A   Occupational History  . 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 Smoking status: Former Smoker    Quit date: 10/15/1950  . Smokeless tobacco: Never Used  . Alcohol Use: Yes     Comment: rare wine  . Drug Use: No  . Sexually Active: Not on file   Other Topics Concern  . Not on file   Social History Narrative   3 [place] living [place] Wife, then son, Celester Lech., to be health care POAWould accept resuscitation attemptsNot sure about tube feeds   Review of Systems Has noted some drippy nose at times Sleeps fine--but awakens early    Objective:   Physical Exam  Constitutional: He appears well-developed and well-nourished. No distress.  Psychiatric: He has a normal mood and affect. His behavior is normal.          Assessment & Plan:

## 2012-09-19 DIAGNOSIS — M79609 Pain in unspecified limb: Secondary | ICD-10-CM | POA: Diagnosis not present

## 2012-09-19 DIAGNOSIS — B351 Tinea unguium: Secondary | ICD-10-CM | POA: Diagnosis not present

## 2012-10-15 DIAGNOSIS — C44320 Squamous cell carcinoma of skin of unspecified parts of face: Secondary | ICD-10-CM

## 2012-10-15 HISTORY — DX: Squamous cell carcinoma of skin of unspecified parts of face: C44.320

## 2012-10-16 DIAGNOSIS — H43399 Other vitreous opacities, unspecified eye: Secondary | ICD-10-CM | POA: Diagnosis not present

## 2012-12-12 DIAGNOSIS — B351 Tinea unguium: Secondary | ICD-10-CM | POA: Diagnosis not present

## 2013-01-02 ENCOUNTER — Encounter: Payer: Self-pay | Admitting: Internal Medicine

## 2013-01-02 ENCOUNTER — Ambulatory Visit (INDEPENDENT_AMBULATORY_CARE_PROVIDER_SITE_OTHER): Payer: Medicare Other | Admitting: Internal Medicine

## 2013-01-02 VITALS — BP 122/70 | HR 68 | Temp 97.5°F | Wt 183.0 lb

## 2013-01-02 DIAGNOSIS — N400 Enlarged prostate without lower urinary tract symptoms: Secondary | ICD-10-CM | POA: Diagnosis not present

## 2013-01-02 DIAGNOSIS — J3489 Other specified disorders of nose and nasal sinuses: Secondary | ICD-10-CM | POA: Diagnosis not present

## 2013-01-02 DIAGNOSIS — G3184 Mild cognitive impairment, so stated: Secondary | ICD-10-CM | POA: Diagnosis not present

## 2013-01-02 NOTE — Assessment & Plan Note (Signed)
Clear improvement on the donepezil Will continue

## 2013-01-02 NOTE — Assessment & Plan Note (Signed)
Not clearly allergic Will try antihistamines If not better--will try ipratropium spray

## 2013-01-02 NOTE — Progress Notes (Signed)
Subjective:    Patient ID: Corey Clark, male    DOB: 08-16-1925, 79 y.o.   MRN: [id number]  HPI Here with wife  Concerned about runny nose and dripping Goes back for some months Not sick Not aware of primary allegies Tried OTC med without success  Notes difficulty with urine Slow and dribbling Nocturia x 1-2 Some urgency but no incontinence  Has been on the higher dose of donepezil He is better per wife--clear improvement on this  Current Outpatient Prescriptions on File Prior to Visit  Medication Sig Dispense Refill  . donepezil (ARICEPT) 10 MG tablet Take 1 tablet (10 mg total) by mouth at bedtime as needed.  90 tablet  3  . sucralfate (CARAFATE) 1 GM/10ML suspension Take 10 mLs (1 g total) by mouth 2 (two) times daily.  1680 mL  3   No current facility-administered medications on file prior to visit.    Allergies  Allergen Reactions  . Tetracyclines & Related Other (See Comments)    Patient not sure it's been years since he's taken med    Past Medical History  Diagnosis Date  . GERD (gastroesophageal reflux disease)   . Colon polyps   . Osteoarthrosis involving, or with mention of more than one site, but not specified as generalized, multiple sites   . Neuropathy due to drug     chemo for stomach cancer  . Stomach cancer 1999    MALT lymphoma --surgery then chemo. Bad infection after with 1 month hospitalization  . BPH (benign prostatic hypertrophy)     Past Surgical History  Procedure Laterality Date  . Partial gastrectomy  1999    2 procedures for stomach cancer  . Rotator cuff repair  1991/1998    left and right done  . Hemorrhoid surgery  1998 & 2000  . Cataract extraction, bilateral  2002, 2003    Family History  Problem Relation Age of Onset  . Cancer Brother   . Heart disease Neg Hx   . Hypertension Neg Hx   . Diabetes Neg Hx     History   Social History  . Marital Status: Married    Spouse Name: N/A    Number of Children: 3  .  Years of Education: N/A   Occupational History  . 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 Smoking status: Former Smoker    Quit date: 10/15/1950  . Smokeless tobacco: Never Used  . Alcohol Use: Yes     Comment: rare wine  . Drug Use: No  . Sexually Active: Not on file   Other Topics Concern  . Not on file   Social History Narrative   3 children in [place]   Has living will    Wife, then son, Tadarius Maland., to be health care POA   Would accept resuscitation attempts   Not sure about tube feeds   Review of Systems Sleeps okay Appetite is good Weight is up 3# Not particularly active     Objective:   Physical Exam  Constitutional: He appears well-developed and well-nourished. No distress.  Neck: Normal range of motion. Neck supple. No thyromegaly present.  Cardiovascular: Normal rate, regular rhythm and normal heart sounds.  Exam reveals no gallop.   No murmur heard. Pulmonary/Chest: Effort normal and breath sounds normal. No respiratory distress. He has no wheezes. He has no rales.  Abdominal: Soft. He exhibits no mass. There is no tenderness.  No suprapubic dullness  Musculoskeletal: He exhibits no edema and no tenderness.  Lymphadenopathy:    He has no cervical adenopathy.  Psychiatric: He has a normal mood and affect. His behavior is normal.          Assessment & Plan:

## 2013-01-02 NOTE — Assessment & Plan Note (Signed)
Mild symptoms He is not ready for meds yet Would try tamsulosin if he worsens

## 2013-01-02 NOTE — Patient Instructions (Addendum)
Please try cetirizine 10mg  daily or fexofenadine 180mg  daily for the runny nose. If that doesn't work, let me know and I can prescribe a spray for your nose.  If your urinary symptoms are more troubling, I can start you on medication for this.

## 2013-01-19 ENCOUNTER — Ambulatory Visit (INDEPENDENT_AMBULATORY_CARE_PROVIDER_SITE_OTHER): Payer: Medicare Other | Admitting: Family Medicine

## 2013-01-19 ENCOUNTER — Encounter: Payer: Self-pay | Admitting: Family Medicine

## 2013-01-19 VITALS — BP 140/72 | HR 76 | Temp 97.5°F | Ht 70.0 in | Wt 184.8 lb

## 2013-01-19 DIAGNOSIS — M6530 Trigger finger, unspecified finger: Secondary | ICD-10-CM

## 2013-01-19 NOTE — Progress Notes (Signed)
[facility] conservation officer at [facility] [address] [place] [address] Phone: [phone] Fax: [phone]  Date:  01/19/2013   Name:  Corey Clark   DOB:  1925/09/03   MRN:  [id number] Gender: male Age: 79 y.o.  Primary Physician:  Tillman Abide, MD  Evaluating MD: Hannah Beat, MD   Chief Complaint: Hand Pain   History of Present Illness:  Corey Clark is a 79 y.o. pleasant patient who presents with the following:  L 4th trigger finger:  Patient Active Problem List  Diagnosis  . GERD (gastroesophageal reflux disease)  . Colon polyps  . Osteoarthritis, multiple sites  . Neuropathy due to drug  . Stomach cancer  . Other constipation  . Trigger finger  . Routine general medical examination at a health care facility  . Mild cognitive impairment  . BPH (benign prostatic hypertrophy)  . Rhinorrhea    Past Medical History  Diagnosis Date  . GERD (gastroesophageal reflux disease)   . Colon polyps   . Osteoarthrosis involving, or with mention of more than one site, but not specified as generalized, multiple sites   . Neuropathy due to drug     chemo for stomach cancer  . Stomach cancer 1999    MALT lymphoma --surgery then chemo. Bad infection after with 1 month hospitalization  . BPH (benign prostatic hypertrophy)     Past Surgical History  Procedure Laterality Date  . Partial gastrectomy  1999    2 procedures for stomach cancer  . Rotator cuff repair  1991/1998    left and right done  . Hemorrhoid surgery  1998 & 2000  . Cataract extraction, bilateral  2002, 2003    History   Social History  . Marital Status: Married    Spouse Name: N/A    Number of Children: 3  . Years of Education: N/A   Occupational History  . 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 Smoking status: Former Smoker    Quit date: 10/15/1950  . Smokeless tobacco: Never Used  . Alcohol Use: Yes     Comment: rare wine  .  Drug Use: No  . Sexually Active: Not on file   Other Topics Concern  . Not on file   Social History Narrative   3 children in [place]   Has living will    Wife, then son, Corey Bucklew., to be health care POA   Would accept resuscitation attempts   Not sure about tube feeds    Family History  Problem Relation Age of Onset  . Cancer Brother   . Heart disease Neg Hx   . Hypertension Neg Hx   . Diabetes Neg Hx     Allergies  Allergen Reactions  . Tetracyclines & Related Other (See Comments)    Patient not sure it's been years since he's taken med    Medication list has been reviewed and updated.  Outpatient Prescriptions Prior to Visit  Medication Sig Dispense Refill  . donepezil (ARICEPT) 10 MG tablet Take 1 tablet (10 mg total) by mouth at bedtime as needed.  90 tablet  3  . sucralfate (CARAFATE) 1 GM/10ML suspension Take 10 mLs (1 g total) by mouth 2 (two) times daily.  1680 mL  3   No facility-administered medications prior to visit.    Review of Systems:   GEN: No fevers, chills. Nontoxic. Primarily MSK c/o today. MSK: Detailed in  the HPI GI: tolerating PO intake without difficulty Neuro: No numbness, parasthesias, or tingling associated. Otherwise the pertinent positives of the ROS are noted above.    Physical Examination: BP 140/72  Pulse 76  Temp(Src) 97.5 F (36.4 C) (Oral)  Ht 5\' 10"  (1.778 m)  Wt 184 lb 12 oz (83.802 kg)  BMI 26.51 kg/m2  SpO2 98%  Ideal Body Weight: Weight in (lb) to have BMI = 25: 173.9   GEN: WDWN, NAD, Non-toxic, Alert & Oriented x 3 HEENT: Atraumatic, Normocephalic.  Ears and Nose: No external deformity. EXTR: No clubbing/cyanosis/edema NEURO: Normal gait.  PSYCH: Normally interactive. Conversant. Not depressed or anxious appearing.  Calm demeanor.   L hand Ecchymosis or edema: neg ROM wrist/hand/digits: full  Carpals, MCP's, digits: NT Distal Ulna and Radius: NT Ecchymosis or edema: neg No  instability Cysts/nodules: flexor nodule Digit triggering: 4th Finkelstein's test: neg Snuffbox tenderness: neg Scaphoid tubercle: NT Resisted supination: NT Full composite fist, no malrotation Grip, all digits: 5/5 str DIPJT: NT PIP JT: NT MCP JT: NT Atrophy: neg  Hand sensation: intact   Assessment and Plan: Trigger finger, acquired   Trigger Finger Injection, L 4th Verbal consent was obtained. Risks (including rare risk of infection, potential risk for skin lightening and potential atrophy), benefits and alternatives were discussed. Prepped with Chloraprep and Ethyl Chloride used for anesthesia. Under sterile conditions, patient injected at palmar crease aiming distally with 45 degree angle towards nodule; injected directly into tendon sheath. Medication flowed freely without resistance.  Needle size: 22 gauge 1 1/2 inch Injection: 1/2 cc of Lidocaine 1% and 1/2 cc of Depo-Medrol 40 mg   Signed, Journei Thomassen T. Fiorella Hanahan, MD 01/19/2013 9:47 AM

## 2013-01-29 ENCOUNTER — Telehealth: Payer: Self-pay

## 2013-01-29 NOTE — Telephone Encounter (Signed)
Since 01/02/13 office visit pt has been taking Cetirizine 10 mg daily and that is not helping runny nose. Pt request Ipratropium spray sent to [facility]. Pt wants to wait for Dr Karle Starch return to have med sent in.Please advise.

## 2013-01-30 NOTE — Telephone Encounter (Signed)
Okay to send Rx for ipratropium 0.3% 2 sprays each nostril daily to bid #1 x 11

## 2013-02-02 MED ORDER — IPRATROPIUM BROMIDE 0.03 % NA SOLN: 2.0000 | Freq: Two times a day (BID) | NASAL | Status: DC

## 2013-02-02 NOTE — Telephone Encounter (Signed)
rx sent to pharmacy and patient advised. 

## 2013-02-20 DIAGNOSIS — B351 Tinea unguium: Secondary | ICD-10-CM | POA: Diagnosis not present

## 2013-02-20 DIAGNOSIS — L723 Sebaceous cyst: Secondary | ICD-10-CM | POA: Diagnosis not present

## 2013-02-20 DIAGNOSIS — M79609 Pain in unspecified limb: Secondary | ICD-10-CM | POA: Diagnosis not present

## 2013-02-20 DIAGNOSIS — L280 Lichen simplex chronicus: Secondary | ICD-10-CM | POA: Diagnosis not present

## 2013-02-20 DIAGNOSIS — Z85828 Personal history of other malignant neoplasm of skin: Secondary | ICD-10-CM | POA: Diagnosis not present

## 2013-02-20 DIAGNOSIS — L570 Actinic keratosis: Secondary | ICD-10-CM | POA: Diagnosis not present

## 2013-03-02 ENCOUNTER — Ambulatory Visit: Payer: Medicare Other | Admitting: Internal Medicine

## 2013-04-16 DIAGNOSIS — C44320 Squamous cell carcinoma of skin of unspecified parts of face: Secondary | ICD-10-CM | POA: Diagnosis not present

## 2013-04-16 DIAGNOSIS — D485 Neoplasm of uncertain behavior of skin: Secondary | ICD-10-CM | POA: Diagnosis not present

## 2013-04-16 DIAGNOSIS — L821 Other seborrheic keratosis: Secondary | ICD-10-CM | POA: Diagnosis not present

## 2013-04-16 DIAGNOSIS — L820 Inflamed seborrheic keratosis: Secondary | ICD-10-CM | POA: Diagnosis not present

## 2013-04-16 DIAGNOSIS — L570 Actinic keratosis: Secondary | ICD-10-CM | POA: Diagnosis not present

## 2013-04-16 DIAGNOSIS — Z85828 Personal history of other malignant neoplasm of skin: Secondary | ICD-10-CM | POA: Diagnosis not present

## 2013-05-15 DIAGNOSIS — M79609 Pain in unspecified limb: Secondary | ICD-10-CM | POA: Diagnosis not present

## 2013-05-15 DIAGNOSIS — B351 Tinea unguium: Secondary | ICD-10-CM | POA: Diagnosis not present

## 2013-06-27 DIAGNOSIS — T1490XA Injury, unspecified, initial encounter: Secondary | ICD-10-CM | POA: Diagnosis not present

## 2013-06-27 DIAGNOSIS — R040 Epistaxis: Secondary | ICD-10-CM | POA: Diagnosis not present

## 2013-06-29 DIAGNOSIS — C44320 Squamous cell carcinoma of skin of unspecified parts of face: Secondary | ICD-10-CM | POA: Diagnosis not present

## 2013-06-29 DIAGNOSIS — Z85828 Personal history of other malignant neoplasm of skin: Secondary | ICD-10-CM | POA: Insufficient documentation

## 2013-07-07 ENCOUNTER — Encounter: Payer: Self-pay | Admitting: Internal Medicine

## 2013-07-07 ENCOUNTER — Ambulatory Visit (INDEPENDENT_AMBULATORY_CARE_PROVIDER_SITE_OTHER): Payer: Medicare Other | Admitting: Internal Medicine

## 2013-07-07 VITALS — BP 138/70 | HR 69 | Wt 184.0 lb

## 2013-07-07 DIAGNOSIS — K219 Gastro-esophageal reflux disease without esophagitis: Secondary | ICD-10-CM

## 2013-07-07 DIAGNOSIS — N400 Enlarged prostate without lower urinary tract symptoms: Secondary | ICD-10-CM | POA: Diagnosis not present

## 2013-07-07 DIAGNOSIS — G3184 Mild cognitive impairment, so stated: Secondary | ICD-10-CM | POA: Diagnosis not present

## 2013-07-07 DIAGNOSIS — Z23 Encounter for immunization: Secondary | ICD-10-CM | POA: Diagnosis not present

## 2013-07-07 DIAGNOSIS — J3489 Other specified disorders of nose and nasal sinuses: Secondary | ICD-10-CM | POA: Diagnosis not present

## 2013-07-07 MED ORDER — FLUTICASONE PROPIONATE 50 MCG/ACT NA SUSP: 2.0000 | Freq: Every day | NASAL | Status: DC

## 2013-07-07 MED ORDER — OMEPRAZOLE 20 MG PO CPDR: 20.0000 mg | DELAYED_RELEASE_CAPSULE | Freq: Every day | ORAL | Status: DC

## 2013-07-07 NOTE — Patient Instructions (Signed)
Please set up an appointment with Dr Patsy Lager to inject your left thumb

## 2013-07-07 NOTE — Assessment & Plan Note (Signed)
Now with mild dysphagia Will add omeprazole

## 2013-07-07 NOTE — Progress Notes (Signed)
Subjective:    Patient ID: Corey Clark, male    DOB: January 13, 1925, 79 y.o.   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 at [facility] Did well with this  Larey Seat off bed recently-- 10 days ago Hit nose and bled Urgent care visit at [facility]---x-ray negative Slight cut at philtrum Had some bruising but now improved Never really hurt--except some neck pain (from ?whiplash)  Still with rhinorrhea Ipratropium and OTC meds were not particularly effective Constantly having to wipe his nose (clear fluid) Feels post nasal drip also Now with some sense of blockage with swallowing or for gas to come up for belch No heartburn on the carafate  Wife notes no changes in mild memory problems No functional decline  No sig arthritis pain Except for left thumb Prior finger pain resolved with cortisone shot  Urine stream is still slow Nocturia x 1 only  Current Outpatient Prescriptions on File Prior to Visit  Medication Sig Dispense Refill  . donepezil (ARICEPT) 10 MG tablet Take 1 tablet (10 mg total) by mouth at bedtime as needed.  90 tablet  3  . ipratropium (ATROVENT) 0.03 % nasal spray Place 2 sprays into the nose every 12 (twelve) hours.  30 mL  11  . sucralfate (CARAFATE) 1 GM/10ML suspension Take 10 mLs (1 g total) by mouth 2 (two) times daily.  1680 mL  3   No current facility-administered medications on file prior to visit.    Allergies  Allergen Reactions  . Tetracyclines & Related Other (See Comments)    Patient not sure it's been years since he's taken med    Past Medical History  Diagnosis Date  . GERD (gastroesophageal reflux disease)   . Colon polyps   . Osteoarthrosis involving, or with mention of more than one site, but not specified as generalized, multiple sites   . Neuropathy due to drug     chemo for stomach cancer  . Stomach cancer 1999    MALT lymphoma --surgery then chemo. Bad infection after with 1 month  hospitalization  . BPH (benign prostatic hypertrophy)   . 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 Partial gastrectomy  1999    2 procedures for stomach cancer  . Rotator cuff repair  1991/1998    left and right done  . Hemorrhoid surgery  1998 & 2000  . Cataract extraction, bilateral  2002, 2003    Family History  Problem Relation Age of Onset  . Cancer Brother   . Heart disease Neg Hx   . Hypertension Neg Hx   . Diabetes Neg Hx     History   Social History  . Marital Status: Married    Spouse Name: N/A    Number of Children: 3  . Years of Education: N/A   Occupational History  . 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 Smoking status: Former Smoker    Quit date: 10/15/1950  . Smokeless tobacco: Never Used  . Alcohol Use: Yes     Comment: rare wine  . Drug Use: No  . Sexual Activity: Not on file   Other Topics Concern  . Not on file   Social History Narrative   3 children in [place]   Has living will    Wife, then son, Machi  Montez Hageman., to be health care POA   Would accept resuscitation attempts   Not sure about tube feeds   Review of Systems Good appetite  Weight stable Sleeps welll Has red itchy spot on end of penis     Objective:   Physical Exam  Constitutional: He appears well-developed and well-nourished. No distress.  Neck: Normal range of motion. Neck supple. No thyromegaly present.  Cardiovascular: Normal rate, regular rhythm, normal heart sounds and intact distal pulses.  Exam reveals no gallop.   No murmur heard. Pulmonary/Chest: Effort normal and breath sounds normal. No respiratory distress. He has no wheezes. He has no rales.  Abdominal: Soft. There is no tenderness.  Genitourinary:  Non specific reddened areas at foreskin and glans  Musculoskeletal: He exhibits no edema and no tenderness.  Lymphadenopathy:     He has no cervical adenopathy.  Psychiatric: He has a normal mood and affect. His behavior is normal.          Assessment & Plan:

## 2013-07-07 NOTE — Assessment & Plan Note (Signed)
Will try fluticasone spray now Consider ENT

## 2013-07-07 NOTE — Assessment & Plan Note (Signed)
Still mild No Rx needed Will try [employer] powder to keep dry

## 2013-07-07 NOTE — Addendum Note (Signed)
Addended by: Sueanne Margarita on: 07/07/2013 03:17 PM   Modules accepted: Orders

## 2013-07-07 NOTE — Assessment & Plan Note (Signed)
Stable on the donepezil No changes

## 2013-07-08 ENCOUNTER — Ambulatory Visit: Payer: Medicare Other | Admitting: Family Medicine

## 2013-07-13 ENCOUNTER — Encounter: Payer: Self-pay | Admitting: Internal Medicine

## 2013-07-13 ENCOUNTER — Ambulatory Visit (INDEPENDENT_AMBULATORY_CARE_PROVIDER_SITE_OTHER): Payer: Medicare Other | Admitting: Family Medicine

## 2013-07-13 ENCOUNTER — Encounter: Payer: Self-pay | Admitting: Family Medicine

## 2013-07-13 VITALS — BP 94/52 | HR 72 | Temp 97.3°F | Ht 70.0 in | Wt 182.5 lb

## 2013-07-13 DIAGNOSIS — M19032 Primary osteoarthritis, left wrist: Secondary | ICD-10-CM

## 2013-07-13 DIAGNOSIS — M19049 Primary osteoarthritis, unspecified hand: Secondary | ICD-10-CM | POA: Insufficient documentation

## 2013-07-13 DIAGNOSIS — M6530 Trigger finger, unspecified finger: Secondary | ICD-10-CM

## 2013-07-13 DIAGNOSIS — M65342 Trigger finger, left ring finger: Secondary | ICD-10-CM

## 2013-07-13 NOTE — Progress Notes (Signed)
Nature conservation officer at [facility] [address] [place] [address] Phone: [phone] Fax: [phone]  Date:  07/13/2013   Name:  Corey Clark   DOB:  [birth year]   MRN:  [id number] Gender: male Age: 79 y.o.  Primary Physician:  Tillman Abide, MD  Evaluating MD: Hannah Beat, MD   Chief Complaint: Left Thumb Injection   History of Present Illness:  Corey Clark is a 79 y.o. pleasant patient who presents with the following:  L CMC OA: longstanding cmc pain, no triggering of the 1st on the left. No old trauma or fractures.   4th trigger finger: trigger finger has returned with some catching and pain.   Patient Active Problem List   Diagnosis Date Noted  . Rhinorrhea 01/02/2013  . BPH (benign prostatic hypertrophy)   . Mild cognitive impairment 06/30/2012  . Routine general medical examination at a health care facility 05/30/2012  . Trigger finger 12/31/2011  . Other constipation 11/26/2011  . GERD (gastroesophageal reflux disease)   . Colon polyps   . Osteoarthritis, multiple sites   . Neuropathy due to drug   . Stomach cancer     Past Medical History  Diagnosis Date  . GERD (gastroesophageal reflux disease)   . Colon polyps   . Osteoarthrosis involving, or with mention of more than one site, but not specified as generalized, multiple sites   . Neuropathy due to drug     chemo for stomach cancer  . Stomach cancer 1999    MALT lymphoma --surgery then chemo. Bad infection after with 1 month hospitalization  . BPH (benign prostatic hypertrophy)   . 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 Partial gastrectomy  1999    2 procedures for stomach cancer  . Rotator cuff repair  1991/1998    left and right done  . Hemorrhoid surgery  1998 & 2000  . Cataract extraction, bilateral  2002, 2003    History   Social History  . Marital Status: Married   Spouse Name: N/A    Number of Children: 3  . Years of Education: N/A   Occupational History  . 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 Smoking status: Former Smoker    Quit date: 10/15/1950  . Smokeless tobacco: Never Used  . Alcohol Use: Yes     Comment: rare wine  . Drug Use: No  . Sexual Activity: Not on file   Other Topics Concern  . Not on file   Social History Narrative   3 children in [place]   Has living will    Wife, then son, Yassine Brunsman., to be health care POA   Would accept resuscitation attempts   Not sure about tube feeds    Family History  Problem Relation Age of Onset  . Cancer Brother   . Heart disease Neg Hx   . Hypertension Neg Hx   . Diabetes Neg Hx     Allergies  Allergen Reactions  . Tetracyclines & Related Other (See Comments)    Patient not sure it's been years since he's taken med    Medication list has been reviewed and updated.  Outpatient Prescriptions Prior to Visit  Medication Sig Dispense Refill  . donepezil (ARICEPT) 10 MG tablet Take 1 tablet (10 mg total) by mouth at bedtime as needed.  90 tablet  3  . fluticasone (FLONASE) 50 MCG/ACT nasal spray Place 2 sprays into the nose daily. In each nostril  16 g  12  . omeprazole (PRILOSEC) 20 MG capsule Take 1 capsule (20 mg total) by mouth daily.  30 capsule  11  . sucralfate (CARAFATE) 1 GM/10ML suspension Take 10 mLs (1 g total) by mouth 2 (two) times daily.  1680 mL  3   No facility-administered medications prior to visit.    Review of Systems:   GEN: No fevers, chills. Nontoxic. Primarily MSK c/o today. MSK: Detailed in the HPI GI: tolerating PO intake without difficulty Neuro: No numbness, parasthesias, or tingling associated. Otherwise the pertinent positives of the ROS are noted above.    Physical Examination: BP 94/52  Pulse 72  Temp(Src) 97.3 F (36.3 C) (Oral)  Ht 5\' 10"  (1.778 m)  Wt 182 lb 8 oz (82.781 kg)  BMI  26.19 kg/m2  Ideal Body Weight: Weight in (lb) to have BMI = 25: 173.9   GEN: WDWN, NAD, Non-toxic, Alert & Oriented x 3 HEENT: Atraumatic, Normocephalic.  Ears and Nose: No external deformity. EXTR: No clubbing/cyanosis/edema NEURO: Normal gait.  PSYCH: Normally interactive. Conversant. Not depressed or anxious appearing.  Calm demeanor.   L hand Ecchymosis or edema: neg ROM wrist/hand/digits: full  Carpals, MCP's, digits: L 1st CMC joint notably ttp Distal Ulna and Radius: NT Ecchymosis or edema: neg No instability Digit triggering: 4th Finkelstein's test: neg Snuffbox tenderness: neg Scaphoid tubercle: NT Resisted supination: NT Full composite fist, no malrotation Grip, all digits: 5/5 str DIPJT: NT PIP JT: NT MCP JT: NT Atrophy: neg  Hand sensation: intact   Assessment and Plan:  CMC DJD(carpometacarpal degenerative joint disease), localized primary, left  Trigger ring finger, left  >25 minutes spent in face to face time with patient, >50% spent in counselling or coordination of care: mostly spent discussing OA, reviewing hand anatomy. I decided to not inject his [facility] today. Conservative care and I think OA tools will help more than anything. See pi.  4th trigger  Trigger Finger Injection, 4th, LEFT Verbal consent was obtained. Risks (including rare risk of infection, potential risk for skin lightening and potential atrophy), benefits and alternatives were discussed. Prepped with Chloraprep and Ethyl Chloride used for anesthesia. Under sterile conditions, patient injected at palmar crease aiming distally with 45 degree angle towards nodule; injected directly into tendon sheath. Medication flowed freely without resistance.  Needle size: 22 gauge 1 1/2 inch Injection: 1/2 cc of Lidocaine 1% and 1/2 cc of Depo-Medrol 40 mg   Orders Today:  No orders of the defined types were placed in this encounter.    Updated Medication List: (Includes new medications, updates  to list, dose adjustments) No orders of the defined types were placed in this encounter.    Medications Discontinued: There are no discontinued medications.    Signed, Elpidio Galea. Aquilla Shambley, MD 07/13/2013 10:53 AM

## 2013-07-13 NOTE — Patient Instructions (Addendum)
OSTEOARTHRITIS:  For symptomatic relief: Tylenol: 2 tablets up to 3-4 times a day As needed NSAIDS are helpful (avoid in kidney disease and ulcers)  For flares, corticosteroid injections help. Glucosamine and Chondroitin often helpful - will take about 3 months to see if you have an effect. If you do, great, keep them up, if none at that point, no need to take in the future.  Omega-3 fish oils may help, 2 grams daily Ice joints on bad days, 20 min, 2-3 x / day Heat is also OK

## 2013-07-21 ENCOUNTER — Encounter: Payer: Self-pay | Admitting: Internal Medicine

## 2013-07-21 MED ORDER — KETOCONAZOLE 2 % EX CREA: TOPICAL_CREAM | Freq: Two times a day (BID) | CUTANEOUS | Status: DC

## 2013-08-14 ENCOUNTER — Ambulatory Visit: Payer: Self-pay | Admitting: Podiatry

## 2013-08-21 ENCOUNTER — Telehealth: Payer: Self-pay

## 2013-08-21 MED ORDER — TRIAMCINOLONE ACETONIDE 0.1 % EX CREA: 1.0000 "application " | TOPICAL_CREAM | Freq: Two times a day (BID) | CUTANEOUS | Status: DC | PRN

## 2013-08-21 NOTE — Telephone Encounter (Signed)
Spoke with patient and advised results Pt is asking if he should see a dermatologist or urologist? If dermatology he would like a recommendation and male physician if possible.

## 2013-08-21 NOTE — Telephone Encounter (Signed)
Please let him know I sent an Rx for a cream to try on the rash. If not better, we may want a dermatologist to look at it

## 2013-08-21 NOTE — Telephone Encounter (Signed)
Pt left v/m was seen in 06/2013 by Dr Alphonsus Sias; pt said problem with penis is not better, med is not helping. Pt said he is having to void several times during the night.Please advise. [facility] [employer] [facility].Pt request cb.

## 2013-08-22 NOTE — Telephone Encounter (Signed)
Please let him know that Dr Armida Sans across from [facility] or Dr Adolphus Birchwood are both in [place] (not sure who he could get in with sooner)  The rash problem definitely needs a dermatologist, not a urologist

## 2013-08-24 NOTE — Telephone Encounter (Signed)
Left detailed message on home answering machine with results.  

## 2013-08-25 ENCOUNTER — Ambulatory Visit (INDEPENDENT_AMBULATORY_CARE_PROVIDER_SITE_OTHER): Payer: Medicare Other | Admitting: Podiatry

## 2013-08-25 ENCOUNTER — Encounter: Payer: Self-pay | Admitting: Podiatry

## 2013-08-25 VITALS — BP 126/48 | HR 70 | Resp 20 | Ht 71.0 in | Wt 175.0 lb

## 2013-08-25 DIAGNOSIS — L259 Unspecified contact dermatitis, unspecified cause: Secondary | ICD-10-CM

## 2013-08-25 DIAGNOSIS — M2010 Hallux valgus (acquired), unspecified foot: Secondary | ICD-10-CM

## 2013-08-25 DIAGNOSIS — B351 Tinea unguium: Secondary | ICD-10-CM

## 2013-08-25 DIAGNOSIS — M79609 Pain in unspecified limb: Secondary | ICD-10-CM | POA: Diagnosis not present

## 2013-08-25 NOTE — Progress Notes (Signed)
Subjective:     Patient ID: Corey Clark, male   DOB: November 19, 1924, 79 y.o.   MRN: [id number]  HPI patient presents stating my nails and I also have a patch of skin that's discolored on top of my foot and at times my bunion get sore on my left foot   Review of Systems     Objective:   Physical Exam  Nursing note and vitals reviewed. Constitutional: He is oriented to person, place, and time.  Cardiovascular: Intact distal pulses.   Musculoskeletal: Normal range of motion.  Neurological: He is oriented to person, place, and time.  Skin: Skin is warm.   patient has a small red patch on the dorsum of the left foot and some redness around the first metatarsal head left. I noted there to be elongated nails incurvated and tender 1-5 both feet     Assessment:     Dermatitis dorsal left with mild structural bunion deformity noted left and nail disease with mycosis 1-5 both feet with discomfort in the corners    Plan:     Discussed all condition and place patient on Naftin gel advised on wider shoes and debrided nailbeds 1-5 both feet with no iatrogenic bleeding noted

## 2013-08-29 ENCOUNTER — Telehealth: Payer: Self-pay

## 2013-08-29 NOTE — Telephone Encounter (Signed)
Pt has a stiff neck that started yesterday and would like a muscle relaxer per call a nurse. Pt has no fever. Pt has tried OTC medication with no success.   Per MD pt can try Salon [place] Patches if not on a blood thinner and can use warm moist heat.   Call a nurse was informed and states she will relay the message and have pt call PCP on Monday if not any better

## 2013-08-31 ENCOUNTER — Ambulatory Visit (INDEPENDENT_AMBULATORY_CARE_PROVIDER_SITE_OTHER): Payer: Medicare Other | Admitting: Internal Medicine

## 2013-08-31 ENCOUNTER — Encounter: Payer: Self-pay | Admitting: Internal Medicine

## 2013-08-31 ENCOUNTER — Telehealth: Payer: Self-pay | Admitting: Family Medicine

## 2013-08-31 VITALS — BP 110/60 | HR 63 | Wt 178.0 lb

## 2013-08-31 DIAGNOSIS — N400 Enlarged prostate without lower urinary tract symptoms: Secondary | ICD-10-CM

## 2013-08-31 DIAGNOSIS — M436 Torticollis: Secondary | ICD-10-CM | POA: Diagnosis not present

## 2013-08-31 MED ORDER — TAMSULOSIN HCL 0.4 MG PO CAPS: 0.4000 mg | ORAL_CAPSULE | Freq: Every day | ORAL | Status: DC

## 2013-08-31 NOTE — Assessment & Plan Note (Signed)
Really quite severe Can continue heat Will make referral to PT at [facility]

## 2013-08-31 NOTE — Progress Notes (Signed)
Subjective:    Patient ID: Corey Clark, male    DOB: 12-Sep-1925, 79 y.o.   MRN: [id number]  HPI Here with wife  Has noted increasing stiffness in his neck Can flex some but very limited with extension and rotation Makes it difficult to get comfortable at night Really bad for the past 3 days Long standing limitation but now much worse  No recent injury Tried heat and OTC pads--not helping Tylenol no help  Having more urinary problems More daytime frequency Nocturia now 2-3 times  Clearly some worse now May have some sense of incomplete empyting  Current Outpatient Prescriptions on File Prior to Visit  Medication Sig Dispense Refill  . donepezil (ARICEPT) 10 MG tablet Take 1 tablet (10 mg total) by mouth at bedtime as needed.  90 tablet  3  . fluticasone (FLONASE) 50 MCG/ACT nasal spray Place 2 sprays into the nose daily. In each nostril  16 g  12  . omeprazole (PRILOSEC) 20 MG capsule Take 1 capsule (20 mg total) by mouth daily.  30 capsule  11  . sucralfate (CARAFATE) 1 GM/10ML suspension Take 10 mLs (1 g total) by mouth 2 (two) times daily.  1680 mL  3  . triamcinolone cream (KENALOG) 0.1 % Apply 1 application topically 2 (two) times daily as needed.  30 g  0   No current facility-administered medications on file prior to visit.    Allergies  Allergen Reactions  . Tetracyclines & Related Other (See Comments)    Patient not sure it's been years since he's taken med    Past Medical History  Diagnosis Date  . GERD (gastroesophageal reflux disease)   . Colon polyps   . Osteoarthrosis involving, or with mention of more than one site, but not specified as generalized, multiple sites   . Neuropathy due to drug     chemo for stomach cancer  . Stomach cancer 1999    MALT lymphoma --surgery then chemo. Bad infection after with 1 month hospitalization  . BPH (benign prostatic hypertrophy)   . SCC (squamous cell carcinoma), face 2014    left preauricular---[facility]     Past Surgical History  Procedure Laterality Date  . Partial gastrectomy  1999    2 procedures for stomach cancer  . Rotator cuff repair  1991/1998    left and right done  . Hemorrhoid surgery  1998 & 2000  . Cataract extraction, bilateral  2002, 2003    Family History  Problem Relation Age of Onset  . Cancer Brother   . Heart disease Neg Hx   . Hypertension Neg Hx   . Diabetes Neg Hx     History   Social History  . Marital Status: Married    Spouse Name: N/A    Number of Children: 3  . Years of Education: N/A   Occupational History  . 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 Smoking status: Former Smoker    Quit date: 10/15/1950  . Smokeless tobacco: Never Used  . Alcohol Use: Yes     Comment: rare wine  . Drug Use: No  . Sexual Activity: Not on file   Other Topics Concern  . Not on file   Social History Narrative   3 children in [place]   Has living will    Wife, then son, Braxon Suder., to be health care POA   Would accept resuscitation attempts   Not  sure about tube feeds   Review of Systems Rash on penis is better with the prescription Not sick No fever     Objective:   Physical Exam  Constitutional: He appears well-developed and well-nourished. No distress.  Neck:  Very stiff Tightness in trapezii Almost no active ROM in any direction No true tenderness  Abdominal: Soft. There is no tenderness.  No obvious suprapubic dullness          Assessment & Plan:

## 2013-08-31 NOTE — Progress Notes (Signed)
Pre-visit discussion using our clinic review tool. No additional management support is needed unless otherwise documented below in the visit note.  

## 2013-08-31 NOTE — Telephone Encounter (Signed)
[facility] Triage Call Report Triage Record Num: [id number] Operator: Geanie Berlin Patient Name: Corey Clark Call Date & Time: 08/29/2013 12:16:57PM Patient Phone: [phone] PCP: Tillman Abide Patient Gender: Male PCP Fax : [phone] Patient DOB: 12/22/1924 Practice Name: [facility] Reason for Call: Caller: Nancy/Spouse; PCP: Tillman Abide (Family Practice); CB#: [phone]; Call regarding Stiff Neck/neck pain ; Onset 08/28/13. Afebrile. Asking for muscle relaxant. Using Tylenol prn with no improvement; last dose at 1000. Constant, mild neck pain when sits still; pain rated 6-7/10 if moves neck. History of chronic restriction of neck movement, worse in last week. Advised to see MD within 72 hours for neck pain and decreased range of motion in neck and has not responded to 48 hours of home care per Neck Pain. Called Dr Rogelia Rohrer per caller request. Dr Rogelia Rohrer ordered patient is too elderly for muscle relaxer; may use otc Salon Paz ifnot on a blood thinner. Use moist heat. See MD within 72 hours. Protocol(s) Used: Neck Pain Recommended Outcome per Protocol: See Provider within 72 Hours Override Outcome if Used in Protocol: Call Provider Immediately RN Reason for Override Outcome: Per Caller Request. Reason for Outcome: Neck pain and decreased range of motion in neck, shoulder, or arm AND has not responded to 48 hours of home care Physician Instructions / Orders: Care Advice: ~ Consistently use home care measures for at least one day. If symptoms do not improve, see your provider. Maintain good posture. Avoid putting pressure on a nerve by not carrying heavy objects such as computer case or backpack. Avoid overuse activities - alternate activities by switching sides and limit length of activity. Avoid lifting heavy objects. ~ ~ SYMPTOM / CONDITION MANAGEMENT ~ CAUTIONS Analgesic/Antipyretic Advice - Acetaminophen: Consider acetaminophen as directed on label  or by pharmacist/provider for pain or fever PRECAUTIONS: - Use if there is no history of liver disease, alcoholism, or intake of three or more alcohol drinks per day - Only if approved by provider during pregnancy or when breastfeeding - During pregnancy, acetaminophen should not be taken more than 3 consecutive days without telling provider - Do not exceed recommended dose or frequency ~ Apply local moist heat (such as a warm, wet wash cloth covered with plastic wrap) to the area for 15-20 minutes every 2-3 hours while awake. ~ 08/29/2013 1:14:30PM Page 1 of 1 [id number]

## 2013-08-31 NOTE — Telephone Encounter (Signed)
I agree with no muscle relaxer Has appt today

## 2013-08-31 NOTE — Assessment & Plan Note (Signed)
Worse Not emptying well now Will try tamsulosin

## 2013-09-03 DIAGNOSIS — M542 Cervicalgia: Secondary | ICD-10-CM | POA: Diagnosis not present

## 2013-09-07 DIAGNOSIS — M542 Cervicalgia: Secondary | ICD-10-CM | POA: Diagnosis not present

## 2013-09-08 DIAGNOSIS — M542 Cervicalgia: Secondary | ICD-10-CM | POA: Diagnosis not present

## 2013-09-14 DIAGNOSIS — M542 Cervicalgia: Secondary | ICD-10-CM | POA: Diagnosis not present

## 2013-09-15 DIAGNOSIS — M542 Cervicalgia: Secondary | ICD-10-CM | POA: Diagnosis not present

## 2013-09-18 DIAGNOSIS — M542 Cervicalgia: Secondary | ICD-10-CM | POA: Diagnosis not present

## 2013-09-21 DIAGNOSIS — M542 Cervicalgia: Secondary | ICD-10-CM | POA: Diagnosis not present

## 2013-09-22 DIAGNOSIS — M542 Cervicalgia: Secondary | ICD-10-CM | POA: Diagnosis not present

## 2013-09-24 DIAGNOSIS — M542 Cervicalgia: Secondary | ICD-10-CM | POA: Diagnosis not present

## 2013-09-28 DIAGNOSIS — M542 Cervicalgia: Secondary | ICD-10-CM | POA: Diagnosis not present

## 2013-10-01 DIAGNOSIS — N476 Balanoposthitis: Secondary | ICD-10-CM | POA: Diagnosis not present

## 2013-10-01 DIAGNOSIS — L578 Other skin changes due to chronic exposure to nonionizing radiation: Secondary | ICD-10-CM | POA: Diagnosis not present

## 2013-10-01 DIAGNOSIS — Z85828 Personal history of other malignant neoplasm of skin: Secondary | ICD-10-CM | POA: Diagnosis not present

## 2013-10-01 DIAGNOSIS — R21 Rash and other nonspecific skin eruption: Secondary | ICD-10-CM | POA: Diagnosis not present

## 2013-10-01 DIAGNOSIS — L570 Actinic keratosis: Secondary | ICD-10-CM | POA: Diagnosis not present

## 2013-10-07 DIAGNOSIS — M542 Cervicalgia: Secondary | ICD-10-CM | POA: Diagnosis not present

## 2013-10-20 DIAGNOSIS — H43819 Vitreous degeneration, unspecified eye: Secondary | ICD-10-CM | POA: Diagnosis not present

## 2013-10-21 DIAGNOSIS — L821 Other seborrheic keratosis: Secondary | ICD-10-CM | POA: Diagnosis not present

## 2013-10-21 DIAGNOSIS — D485 Neoplasm of uncertain behavior of skin: Secondary | ICD-10-CM | POA: Diagnosis not present

## 2013-10-21 DIAGNOSIS — C44611 Basal cell carcinoma of skin of unspecified upper limb, including shoulder: Secondary | ICD-10-CM | POA: Diagnosis not present

## 2013-10-21 DIAGNOSIS — L538 Other specified erythematous conditions: Secondary | ICD-10-CM | POA: Diagnosis not present

## 2013-10-21 DIAGNOSIS — L719 Rosacea, unspecified: Secondary | ICD-10-CM | POA: Diagnosis not present

## 2013-10-21 DIAGNOSIS — B079 Viral wart, unspecified: Secondary | ICD-10-CM | POA: Diagnosis not present

## 2013-10-21 DIAGNOSIS — C44320 Squamous cell carcinoma of skin of unspecified parts of face: Secondary | ICD-10-CM | POA: Diagnosis not present

## 2013-10-21 DIAGNOSIS — L570 Actinic keratosis: Secondary | ICD-10-CM | POA: Diagnosis not present

## 2013-10-28 DIAGNOSIS — L570 Actinic keratosis: Secondary | ICD-10-CM | POA: Diagnosis not present

## 2013-10-29 ENCOUNTER — Other Ambulatory Visit: Payer: Self-pay | Admitting: Internal Medicine

## 2013-10-30 ENCOUNTER — Other Ambulatory Visit: Payer: Self-pay | Admitting: *Deleted

## 2013-10-30 MED ORDER — TAMSULOSIN HCL 0.4 MG PO CAPS
0.4000 mg | ORAL_CAPSULE | Freq: Every day | ORAL | Status: DC
Start: 2013-10-30 — End: 2013-11-06

## 2013-11-04 DIAGNOSIS — C4442 Squamous cell carcinoma of skin of scalp and neck: Secondary | ICD-10-CM | POA: Diagnosis not present

## 2013-11-06 ENCOUNTER — Encounter: Payer: Self-pay | Admitting: Podiatry

## 2013-11-06 ENCOUNTER — Ambulatory Visit (INDEPENDENT_AMBULATORY_CARE_PROVIDER_SITE_OTHER): Payer: Medicare Other | Admitting: Podiatry

## 2013-11-06 ENCOUNTER — Ambulatory Visit: Payer: Medicare Other | Admitting: Podiatry

## 2013-11-06 VITALS — BP 103/53 | HR 77 | Resp 16

## 2013-11-06 DIAGNOSIS — B351 Tinea unguium: Secondary | ICD-10-CM | POA: Diagnosis not present

## 2013-11-06 DIAGNOSIS — M79609 Pain in unspecified limb: Secondary | ICD-10-CM | POA: Diagnosis not present

## 2013-11-06 NOTE — Progress Notes (Signed)
Subjective:     Patient ID: Corey Clark, male   DOB: [birth year], 80 y.o.   MRN: [id number]  HPI patient is found to have thick nail disease that are painful in the corners 1-5 of both feet that he cannot take care of himself   Review of Systems     Objective:   Physical Exam Neurovascular status intact no health history changes with thick nail bed and pain 1-5 both feet    Assessment:     Mycotic nail infection with pain 1-5 both feet    Plan:     Debridement of painful nailbeds 1-5 both feet with no iatrogenic bleeding noted

## 2013-11-06 NOTE — Progress Notes (Signed)
Pt states just a foot check and a little trimming

## 2013-11-10 ENCOUNTER — Ambulatory Visit: Payer: Medicare Other | Admitting: Podiatry

## 2013-12-08 DIAGNOSIS — L570 Actinic keratosis: Secondary | ICD-10-CM | POA: Diagnosis not present

## 2013-12-15 DIAGNOSIS — L538 Other specified erythematous conditions: Secondary | ICD-10-CM | POA: Diagnosis not present

## 2013-12-15 DIAGNOSIS — R21 Rash and other nonspecific skin eruption: Secondary | ICD-10-CM | POA: Diagnosis not present

## 2013-12-15 DIAGNOSIS — N476 Balanoposthitis: Secondary | ICD-10-CM | POA: Diagnosis not present

## 2014-01-04 ENCOUNTER — Encounter: Payer: Self-pay | Admitting: Internal Medicine

## 2014-01-04 ENCOUNTER — Ambulatory Visit (INDEPENDENT_AMBULATORY_CARE_PROVIDER_SITE_OTHER): Payer: Medicare Other | Admitting: Internal Medicine

## 2014-01-04 VITALS — BP 110/60 | HR 56 | Temp 97.8°F | Ht 71.0 in | Wt 178.0 lb

## 2014-01-04 DIAGNOSIS — Z0000 Encounter for general adult medical examination without abnormal findings: Secondary | ICD-10-CM | POA: Diagnosis not present

## 2014-01-04 DIAGNOSIS — N400 Enlarged prostate without lower urinary tract symptoms: Secondary | ICD-10-CM | POA: Diagnosis not present

## 2014-01-04 DIAGNOSIS — J3489 Other specified disorders of nose and nasal sinuses: Secondary | ICD-10-CM

## 2014-01-04 DIAGNOSIS — G3184 Mild cognitive impairment, so stated: Secondary | ICD-10-CM

## 2014-01-04 DIAGNOSIS — K219 Gastro-esophageal reflux disease without esophagitis: Secondary | ICD-10-CM

## 2014-01-04 DIAGNOSIS — Z23 Encounter for immunization: Secondary | ICD-10-CM

## 2014-01-04 LAB — CBC WITH DIFFERENTIAL/PLATELET
BASOS ABS: 0 10*3/uL (ref 0.0–0.1)
Basophils Relative: 0.6 % (ref 0.0–3.0)
EOS ABS: 0.2 10*3/uL (ref 0.0–0.7)
Eosinophils Relative: 2.4 % (ref 0.0–5.0)
HEMATOCRIT: 43.2 % (ref 39.0–52.0)
Hemoglobin: 14.4 g/dL (ref 13.0–17.0)
LYMPHS ABS: 2.2 10*3/uL (ref 0.7–4.0)
Lymphocytes Relative: 35.4 % (ref 12.0–46.0)
MCHC: 33.4 g/dL (ref 30.0–36.0)
MCV: 91.1 fl (ref 78.0–100.0)
MONOS PCT: 15.3 % — AB (ref 3.0–12.0)
Monocytes Absolute: 1 10*3/uL (ref 0.1–1.0)
Neutro Abs: 2.9 10*3/uL (ref 1.4–7.7)
Neutrophils Relative %: 46.3 % (ref 43.0–77.0)
Platelets: 154 10*3/uL (ref 150.0–400.0)
RBC: 4.74 Mil/uL (ref 4.22–5.81)
RDW: 13.9 % (ref 11.5–14.6)
WBC: 6.3 10*3/uL (ref 4.5–10.5)

## 2014-01-04 LAB — COMPREHENSIVE METABOLIC PANEL
ALT: 17 U/L (ref 0–53)
AST: 34 U/L (ref 0–37)
Albumin: 3.7 g/dL (ref 3.5–5.2)
Alkaline Phosphatase: 75 U/L (ref 39–117)
BILIRUBIN TOTAL: 1.1 mg/dL (ref 0.3–1.2)
BUN: 20 mg/dL (ref 6–23)
CALCIUM: 9.2 mg/dL (ref 8.4–10.5)
CHLORIDE: 103 meq/L (ref 96–112)
CO2: 30 meq/L (ref 19–32)
Creatinine, Ser: 1.1 mg/dL (ref 0.4–1.5)
GFR: 66.67 mL/min (ref >60.00)
Glucose, Bld: 85 mg/dL (ref 70–99)
Potassium: 4.8 mEq/L (ref 3.5–5.1)
SODIUM: 138 meq/L (ref 135–145)
TOTAL PROTEIN: 7.5 g/dL (ref 6.0–8.3)

## 2014-01-04 LAB — TSH: TSH: 0.68 u[IU]/mL (ref 0.35–5.50)

## 2014-01-04 LAB — T4, FREE: Free T4: 0.84 ng/dL (ref 0.60–1.60)

## 2014-01-04 NOTE — Progress Notes (Signed)
Subjective:    Patient ID: Corey Clark, male    DOB: 1925/04/08, 80 y.o.   MRN: [id number]  HPI Here for Medicare wellness and follow up Reviewed form [facility] is okay. Hearing okay with aides Did have one fall---walks well usually (he fell out of bed) No depression or anhedonia Remains independent with all instrumental ADLs---he is the cook, etc Reviewed his multiple other doctors Does have alcohol-- 1-2 per day No tobacco Does exercise regularly  Wife has not seen any progression of memory issues Executive function is good. No apraxia Still drives-- keeps local. Does fine with simple routes  Stopped tamsulosin Only nocturia x 1  No sig daytime urgency-- but tries to keep bladder empty No increased frequency  Stomach has been fine No heartburn No swallowing changes---does get sensation that "something is in my throat". This has not changed at all Clears his throat regularly  Still with regular rhinorrhea No help with various meds we have tried  Current Outpatient Prescriptions on File Prior to Visit  Medication Sig Dispense Refill  . donepezil (ARICEPT) 10 MG tablet TAKE 1 TABLET AT BEDTIME AS NEEDED  90 tablet  2  . omeprazole (PRILOSEC) 20 MG capsule Take 1 capsule (20 mg total) by mouth daily.  30 capsule  11  . sucralfate (CARAFATE) 1 GM/10ML suspension Take 10 mLs (1 g total) by mouth 2 (two) times daily.  1680 mL  3   No current facility-administered medications on file prior to visit.    Allergies  Allergen Reactions  . Tetracyclines & Related Other (See Comments)    Patient not sure it's been years since he's taken med    Past Medical History  Diagnosis Date  . GERD (gastroesophageal reflux disease)   . Colon polyps   . Osteoarthrosis involving, or with mention of more than one site, but not specified as generalized, multiple sites   . Neuropathy due to drug     chemo for stomach cancer  . Stomach cancer 1999    MALT lymphoma --surgery then  chemo. Bad infection after with 1 month hospitalization  . BPH (benign prostatic hypertrophy)   . 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 Partial gastrectomy  1999    2 procedures for stomach cancer  . Rotator cuff repair  1991/1998    left and right done  . Hemorrhoid surgery  1998 & 2000  . Cataract extraction, bilateral  2002, 2003    Family History  Problem Relation Age of Onset  . Cancer Brother   . Heart disease Neg Hx   . Hypertension Neg Hx   . Diabetes Neg Hx     History   Social History  . Marital Status: Married    Spouse Name: N/A    Number of Children: 3  . Years of Education: N/A   Occupational History  . 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 Smoking status: Former Smoker    Quit date: 10/15/1950  . Smokeless tobacco: Never Used  . Alcohol Use: Yes     Comment: rare wine  . Drug Use: No  . Sexual Activity: Not on file   Other Topics Concern  . Not on file   Social History Narrative   3 children in [place]      Has living will    Wife,  then son, Keiondre Colee., to be health care POA   Would accept resuscitation attempts   Not sure about tube feeds-- probably doesn't want if cognitively unaware   Review of Systems Appetite is fine Weight is stable Sleeps fine    Objective:   Physical Exam  Constitutional: He appears well-developed and well-nourished. No distress.  HENT:  Mouth/Throat: Oropharynx is clear and moist. No oropharyngeal exudate.  Neck: Normal range of motion. Neck supple. No thyromegaly present.  Cardiovascular: Normal rate, regular rhythm, normal heart sounds and intact distal pulses.  Exam reveals no gallop.   No murmur heard. Pulmonary/Chest: Effort normal and breath sounds normal. No respiratory distress. He has no wheezes. He has no rales.  Abdominal: Soft. There is no tenderness.    Musculoskeletal: He exhibits no edema and no tenderness.  Lymphadenopathy:    He has no cervical adenopathy.  Psychiatric: He has a normal mood and affect. His behavior is normal.          Assessment & Plan:

## 2014-01-04 NOTE — Assessment & Plan Note (Signed)
Resistant to multiple meds Just a bother--not worrisome

## 2014-01-04 NOTE — Progress Notes (Signed)
Pre visit review using our clinic review tool, if applicable. No additional management support is needed unless otherwise documented below in the visit note. 

## 2014-01-04 NOTE — Assessment & Plan Note (Signed)
Doing fine Off the tamsulosin now

## 2014-01-04 NOTE — Assessment & Plan Note (Signed)
Stable cognitive and functional status  On the donepezil

## 2014-01-04 NOTE — Assessment & Plan Note (Signed)
Ongoing symptoms Not clear if throat is from this or rhinorrhea Will continue meds

## 2014-01-04 NOTE — Assessment & Plan Note (Signed)
I have personally reviewed the Medicare Annual Wellness questionnaire and have noted 1. The patient's medical and social history 2. Their use of alcohol, tobacco or illicit drugs 3. Their current medications and supplements 4. The patient's functional ability including ADL's, fall risks, home safety risks and hearing or visual             impairment. 5. Diet and physical activities 6. Evidence for depression or mood disorders  The patients weight, height, BMI and visual acuity have been recorded in the chart I have made referrals, counseling and provided education to the patient based review of the above and I have provided the pt with a written personalized care plan for preventive services.  I have provided you with a copy of your personalized plan for preventive services. Please take the time to review along with your updated medication list.  Doing well Will give prevnar 

## 2014-01-04 NOTE — Addendum Note (Signed)
Addended by: Despina Hidden on: 01/04/2014 12:43 PM   Modules accepted: Orders

## 2014-01-07 ENCOUNTER — Encounter: Payer: Self-pay | Admitting: Internal Medicine

## 2014-01-12 DIAGNOSIS — C4491 Basal cell carcinoma of skin, unspecified: Secondary | ICD-10-CM | POA: Diagnosis not present

## 2014-01-14 DIAGNOSIS — L538 Other specified erythematous conditions: Secondary | ICD-10-CM | POA: Diagnosis not present

## 2014-02-03 DIAGNOSIS — D485 Neoplasm of uncertain behavior of skin: Secondary | ICD-10-CM | POA: Diagnosis not present

## 2014-02-03 DIAGNOSIS — Z85828 Personal history of other malignant neoplasm of skin: Secondary | ICD-10-CM | POA: Diagnosis not present

## 2014-02-03 DIAGNOSIS — L570 Actinic keratosis: Secondary | ICD-10-CM | POA: Diagnosis not present

## 2014-02-05 ENCOUNTER — Ambulatory Visit: Payer: Medicare Other | Admitting: Podiatry

## 2014-02-12 ENCOUNTER — Ambulatory Visit (INDEPENDENT_AMBULATORY_CARE_PROVIDER_SITE_OTHER): Payer: Medicare Other | Admitting: Podiatry

## 2014-02-12 ENCOUNTER — Ambulatory Visit: Payer: Medicare Other | Admitting: Podiatry

## 2014-02-12 VITALS — Resp 16 | Ht 71.0 in | Wt 178.0 lb

## 2014-02-12 DIAGNOSIS — M79609 Pain in unspecified limb: Secondary | ICD-10-CM | POA: Diagnosis not present

## 2014-02-12 DIAGNOSIS — B351 Tinea unguium: Secondary | ICD-10-CM | POA: Diagnosis not present

## 2014-02-14 NOTE — Progress Notes (Signed)
Subjective:     Patient ID: Corey Clark, male   DOB: 1924/12/31, 80 y.o.   MRN: [id number]  HPI patient states my nails are thick and I cannot cut them and they are painful   Review of Systems     Objective:   Physical Exam Neurovascular status intact no health history changes noted with thick nailbeds 1-5 both feet that are painful    Assessment:     Mycotic nail infection with pain 1-5 both feet    Plan:     Debridement painful nailbeds 1-5 both feet with no bleeding noted

## 2014-02-19 ENCOUNTER — Ambulatory Visit: Payer: Medicare Other | Admitting: Podiatry

## 2014-03-04 DIAGNOSIS — L723 Sebaceous cyst: Secondary | ICD-10-CM | POA: Diagnosis not present

## 2014-03-04 DIAGNOSIS — D485 Neoplasm of uncertain behavior of skin: Secondary | ICD-10-CM | POA: Diagnosis not present

## 2014-03-04 DIAGNOSIS — L570 Actinic keratosis: Secondary | ICD-10-CM | POA: Diagnosis not present

## 2014-03-04 DIAGNOSIS — L719 Rosacea, unspecified: Secondary | ICD-10-CM | POA: Diagnosis not present

## 2014-03-15 HISTORY — PX: TENDON RELEASE: SHX230

## 2014-03-21 ENCOUNTER — Emergency Department: Payer: Self-pay | Admitting: Internal Medicine

## 2014-03-21 DIAGNOSIS — R1110 Vomiting, unspecified: Secondary | ICD-10-CM | POA: Diagnosis not present

## 2014-03-21 DIAGNOSIS — R112 Nausea with vomiting, unspecified: Secondary | ICD-10-CM | POA: Diagnosis not present

## 2014-03-21 DIAGNOSIS — N3289 Other specified disorders of bladder: Secondary | ICD-10-CM | POA: Diagnosis not present

## 2014-03-21 DIAGNOSIS — Z0389 Encounter for observation for other suspected diseases and conditions ruled out: Secondary | ICD-10-CM | POA: Diagnosis not present

## 2014-03-21 DIAGNOSIS — J189 Pneumonia, unspecified organism: Secondary | ICD-10-CM | POA: Diagnosis not present

## 2014-03-21 DIAGNOSIS — R109 Unspecified abdominal pain: Secondary | ICD-10-CM | POA: Diagnosis not present

## 2014-03-21 DIAGNOSIS — K8020 Calculus of gallbladder without cholecystitis without obstruction: Secondary | ICD-10-CM | POA: Diagnosis not present

## 2014-03-21 DIAGNOSIS — K219 Gastro-esophageal reflux disease without esophagitis: Secondary | ICD-10-CM | POA: Diagnosis not present

## 2014-03-21 DIAGNOSIS — Z79899 Other long term (current) drug therapy: Secondary | ICD-10-CM | POA: Diagnosis not present

## 2014-03-21 DIAGNOSIS — F068 Other specified mental disorders due to known physiological condition: Secondary | ICD-10-CM | POA: Diagnosis not present

## 2014-03-21 LAB — CBC WITH DIFFERENTIAL/PLATELET
BASOS ABS: 0 10*3/uL (ref 0.0–0.1)
BASOS PCT: 0.5 %
Eosinophil #: 0.2 10*3/uL (ref 0.0–0.7)
Eosinophil %: 2.4 %
HCT: 41.8 % (ref 40.0–52.0)
HGB: 13.6 g/dL (ref 13.0–18.0)
LYMPHS ABS: 1.3 10*3/uL (ref 1.0–3.6)
Lymphocyte %: 19.6 %
MCH: 30 pg (ref 26.0–34.0)
MCHC: 32.5 g/dL (ref 32.0–36.0)
MCV: 92 fL (ref 80–100)
MONO ABS: 0.7 x10 3/mm (ref 0.2–1.0)
Monocyte %: 10.5 %
Neutrophil #: 4.4 10*3/uL (ref 1.4–6.5)
Neutrophil %: 67 %
PLATELETS: 128 10*3/uL — AB (ref 150–440)
RBC: 4.52 10*6/uL (ref 4.40–5.90)
RDW: 14 % (ref 11.5–14.5)
WBC: 6.5 10*3/uL (ref 3.8–10.6)

## 2014-03-21 LAB — TROPONIN I: Troponin-I: <0.02 ng/mL

## 2014-03-21 LAB — URINALYSIS, COMPLETE
BILIRUBIN, UR: NEGATIVE
Bacteria: NONE SEEN
Blood: NEGATIVE
Glucose,UR: NEGATIVE mg/dL (ref 0–75)
Leukocyte Esterase: NEGATIVE
Nitrite: NEGATIVE
PROTEIN: NEGATIVE
Ph: 6 (ref 4.5–8.0)
Specific Gravity: 1.018 (ref 1.003–1.030)
Squamous Epithelial: <1
WBC UR: 1 /HPF (ref 0–5)

## 2014-03-21 LAB — COMPREHENSIVE METABOLIC PANEL
ALBUMIN: 3.4 g/dL (ref 3.4–5.0)
AST: 37 U/L (ref 15–37)
Alkaline Phosphatase: 89 U/L
Anion Gap: 4 — ABNORMAL LOW (ref 7–16)
BUN: 15 mg/dL (ref 7–18)
Bilirubin,Total: 0.6 mg/dL (ref 0.2–1.0)
CHLORIDE: 105 mmol/L (ref 98–107)
Calcium, Total: 8.7 mg/dL (ref 8.5–10.1)
Co2: 30 mmol/L (ref 21–32)
Creatinine: 1.27 mg/dL (ref 0.60–1.30)
EGFR (Non-African Amer.): 51 — ABNORMAL LOW
GFR CALC AF AMER: 59 — AB
GLUCOSE: 143 mg/dL — AB (ref 65–99)
OSMOLALITY: 281 (ref 275–301)
Potassium: 4.3 mmol/L (ref 3.5–5.1)
SGPT (ALT): 19 U/L (ref 12–78)
Sodium: 139 mmol/L (ref 136–145)
Total Protein: 7.7 g/dL (ref 6.4–8.2)

## 2014-03-21 LAB — LIPASE, BLOOD: Lipase: 109 U/L (ref 73–393)

## 2014-03-23 ENCOUNTER — Encounter: Payer: Self-pay | Admitting: Internal Medicine

## 2014-03-23 ENCOUNTER — Ambulatory Visit (INDEPENDENT_AMBULATORY_CARE_PROVIDER_SITE_OTHER): Payer: Medicare Other | Admitting: Internal Medicine

## 2014-03-23 VITALS — BP 110/60 | HR 66 | Temp 97.8°F | Resp 16 | Wt 180.0 lb

## 2014-03-23 DIAGNOSIS — J189 Pneumonia, unspecified organism: Secondary | ICD-10-CM | POA: Diagnosis not present

## 2014-03-23 DIAGNOSIS — J181 Lobar pneumonia, unspecified organism: Principal | ICD-10-CM

## 2014-03-23 NOTE — Patient Instructions (Addendum)
Please try a probiotic, like activia yogurt or align pills, while you are on the antibiotic. Please call if your symptoms aren't better by next week.

## 2014-03-23 NOTE — Progress Notes (Signed)
Subjective:    Patient ID: Corey Clark, male    DOB: 03/10/1925, 80 y.o.   MRN: [id number]  HPI Here with wife Reviewed ER records from 6/7  After eating meal in 6/6--started getting belching and abdominal soreness "Serious belching" per wife Tried tums without effect Vomiting at home and in ER Went to [facility] --- CXR and abd CT showed apparent RML pneumonia Had gallstones but no evidence of cholecystititis Given IV zosyn and sent home on levaquin Records show gram variable rod in 1 aerobic blood culture. Spoke to micro lab--just went positive yesterday and no ID yet---may be contaminant  Has had diarrhea since starting the levaquin (5-6 times per day) Given 5 total doses to take Hasn't needed nausea meds  No fever No cough of note No SOB  No abdominal pain now Just gets fecal urgency  Current Outpatient Prescriptions on File Prior to Visit  Medication Sig Dispense Refill  . donepezil (ARICEPT) 10 MG tablet TAKE 1 TABLET AT BEDTIME AS NEEDED  90 tablet  2  . Multiple Vitamin (MULTIVITAMIN) tablet Take 1 tablet by mouth daily.      . sucralfate (CARAFATE) 1 GM/10ML suspension Take 10 mLs (1 g total) by mouth 2 (two) times daily.  1680 mL  3   No current facility-administered medications on file prior to visit.    Allergies  Allergen Reactions  . Tetracyclines & Related Other (See Comments)    Patient not sure it's been years since he's taken med    Past Medical History  Diagnosis Date  . GERD (gastroesophageal reflux disease)   . Colon polyps   . Osteoarthrosis involving, or with mention of more than one site, but not specified as generalized, multiple sites   . Neuropathy due to drug     chemo for stomach cancer  . Stomach cancer 1999    MALT lymphoma --surgery then chemo. Bad infection after with 1 month hospitalization  . BPH (benign prostatic hypertrophy)   . SCC (squamous cell carcinoma), face 2014    left preauricular---[facility]    Past Surgical  History  Procedure Laterality Date  . Partial gastrectomy  1999    2 procedures for stomach cancer  . Rotator cuff repair  1991/1998    left and right done  . Hemorrhoid surgery  1998 & 2000  . Cataract extraction, bilateral  2002, 2003    Family History  Problem Relation Age of Onset  . Cancer Brother   . Heart disease Neg Hx   . Hypertension Neg Hx   . Diabetes Neg Hx     History   Social History  . Marital Status: Married    Spouse Name: N/A    Number of Children: 3  . Years of Education: N/A   Occupational History  . 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 Smoking status: Former Smoker    Quit date: 10/15/1950  . Smokeless tobacco: Never Used  . Alcohol Use: Yes     Comment: rare wine  . Drug Use: No  . Sexual Activity: Not on file   Other Topics Concern  . Not on file   Social History Narrative   3 children in [place]      Has living will    Wife, then son, Unique Searfoss., to be health care POA   Would accept resuscitation attempts   Not sure about tube feeds-- probably doesn't want if  cognitively unaware   Review of Systems Voids okay Appetite is fair--not sure what to eat    Objective:   Physical Exam  Constitutional: He appears well-developed and well-nourished. No distress.  Neck: Normal range of motion. Neck supple. No thyromegaly present.  Cardiovascular: Normal rate, regular rhythm and normal heart sounds.  Exam reveals no gallop.   No murmur heard. Pulmonary/Chest: Effort normal and breath sounds normal. No respiratory distress. He has no wheezes. He has no rales.  No dullness  Abdominal: Soft. Bowel sounds are normal. He exhibits no distension. There is no tenderness. There is no rebound and no guarding.  Musculoskeletal: He exhibits no edema.  Lymphadenopathy:    He has no cervical adenopathy.  Psychiatric: He has a normal mood and affect. His behavior is normal.          Assessment &  Plan:

## 2014-03-23 NOTE — Assessment & Plan Note (Signed)
Atypical presentation with only GI symptoms Did have pleural effusion which might account for GI problems Positive blood culture in 1 aerobic bottle---micro lab still working on this (contaminant is fairly likely)----they will call with more info when available No apparent mass--but can't exclude this  Will finish out the levaquin high dose for 5 days If any ongoing symptoms---would extend course with lower dose levaquin (unless treatment is changed due to blood culture) Will plan to see back in about 3 weeks

## 2014-03-23 NOTE — Progress Notes (Signed)
Pre visit review using our clinic review tool, if applicable. No additional management support is needed unless otherwise documented below in the visit note. 

## 2014-03-24 ENCOUNTER — Ambulatory Visit: Payer: Medicare Other | Admitting: Internal Medicine

## 2014-03-25 DIAGNOSIS — L819 Disorder of pigmentation, unspecified: Secondary | ICD-10-CM | POA: Diagnosis not present

## 2014-03-25 DIAGNOSIS — L719 Rosacea, unspecified: Secondary | ICD-10-CM | POA: Diagnosis not present

## 2014-03-25 DIAGNOSIS — L570 Actinic keratosis: Secondary | ICD-10-CM | POA: Diagnosis not present

## 2014-03-26 LAB — CULTURE, BLOOD (SINGLE)

## 2014-04-05 ENCOUNTER — Encounter: Payer: Self-pay | Admitting: Family Medicine

## 2014-04-05 ENCOUNTER — Ambulatory Visit (INDEPENDENT_AMBULATORY_CARE_PROVIDER_SITE_OTHER): Payer: Medicare Other | Admitting: Family Medicine

## 2014-04-05 VITALS — BP 108/70 | HR 62 | Temp 97.7°F | Ht 71.0 in | Wt 179.2 lb

## 2014-04-05 DIAGNOSIS — M6530 Trigger finger, unspecified finger: Secondary | ICD-10-CM | POA: Diagnosis not present

## 2014-04-05 DIAGNOSIS — M19032 Primary osteoarthritis, left wrist: Secondary | ICD-10-CM

## 2014-04-05 DIAGNOSIS — M19019 Primary osteoarthritis, unspecified shoulder: Secondary | ICD-10-CM | POA: Diagnosis not present

## 2014-04-05 DIAGNOSIS — M19049 Primary osteoarthritis, unspecified hand: Secondary | ICD-10-CM | POA: Diagnosis not present

## 2014-04-05 DIAGNOSIS — M1812 Unilateral primary osteoarthritis of first carpometacarpal joint, left hand: Secondary | ICD-10-CM

## 2014-04-05 DIAGNOSIS — M19012 Primary osteoarthritis, left shoulder: Secondary | ICD-10-CM

## 2014-04-05 NOTE — Progress Notes (Signed)
Pre visit review using our clinic review tool, if applicable. No additional management support is needed unless otherwise documented below in the visit note. 

## 2014-04-05 NOTE — Progress Notes (Signed)
[place] [address] Phone: [phone] Fax: [phone]  Patient ID: Corey Clark MRN: [id number], DOB: [birth year], 80 y.o. Date of Encounter: 04/05/2014  Primary Physician:  Viviana Simpler, MD   Chief Complaint: Trigger Finger and Shoulder Pain   Subjective:   History of Present Illness:  Corey Clark is a 80 y.o. very pleasant male patient who presents with the following:  The patient is here for multiple orthopedic issues. Initially, and the patient does have some mild Alzheimer's disease, he complains about some trigger fingers that he has, and he has one of the fourth digit on the left, and I have actually injected this 2 times previously. He did have some relief for a number of months, but his symptoms always have come back. He is here again for repeat injection or other advice.  He also has some significant pain on his left thumb. He thinks it is a trigger finger, but when he points he actually points at [facility] joint, and this is been initiated I discussed with him in the past, and felt that this was previously primarily [facility] joint arthritis.  He also has pain in his left shoulder, this is keeping him up at night, and he grinds quite a bit when he moves it. He does have a history of having a rotator cuff repair did not not keep, and he had a full retraction per his and his wife's report, and he has been having problems intermittently with the shoulder since then.  L Shoulder and CMC  Past Medical History, Surgical History, Social History, Family History, Problem List, Medications, and Allergies have been reviewed and updated if relevant.  Review of Systems:  GEN: No fevers, chills. Nontoxic. Primarily MSK c/o today. MSK: Detailed in the HPI GI: tolerating PO intake without difficulty Neuro: No numbness, parasthesias, or tingling associated. Otherwise the pertinent positives of the ROS are noted above.   Objective:   Physical  Examination: BP 108/70  Pulse 62  Temp(Src) 97.7 F (36.5 C) (Oral)  Ht 5\' 11"  (1.803 m)  Wt 179 lb 4 oz (81.307 kg)  BMI 25.01 kg/m2   GEN: WDWN, NAD, Non-toxic, Alert & Oriented x 3 HEENT: Atraumatic, Normocephalic.  Ears and Nose: No external deformity. EXTR: No clubbing/cyanosis/edema NEURO: Normal gait.  PSYCH: Normally interactive. Conversant. Not depressed or anxious appearing.  Calm demeanor.    Left shoulder has some very significant crepitus. He does have a lack of about 10 of motion in abduction and flexion. Strength is preserved and 5/5 throughout. Does have some pain with Hawkins testing and your testing as well.  Diffuse signs of osteoarthritic changes throughout both hands. On the left hand at the first [facility] joint the patient has his maximal pain. He also has a trigger finger on the 4th.  Radiology: No results found.  Assessment & Plan:   Arthritis of left shoulder region  Trigger finger, acquired - Plan: Ambulatory referral to Orthopedic Surgery  Primary osteoarthritis of first carpometacarpal joint of left hand  CMC DJD(carpometacarpal degenerative joint disease), localized primary, left  Trigger finger, status post injection x2 with failure to improve. Consult Dr. Eda Keys for definitive management.  For symptomatic relief, I am going to try to inject the patient's [facility] joint, as well as his intra-articular space the left shoulder.  Intrarticular Shoulder Injection, LEFT Verbal consent was obtained from the patient. Risks including infection explained and contrasted with benefits and alternatives. Patient prepped with Chloraprep and Ethyl Chloride  used for anesthesia. An intraarticular shoulder injection was performed using the posterior approach. The patient tolerated the procedure well and had decreased pain post injection. No complications. Injection: 8 cc of Lidocaine 1% and Depo-Medrol 80 mg. Needle: 22 gauge   CMC joint injection, LEFT Verbal  consent was obtained. Risks (including rare infection, pain), benefits, and alternatives were discussed. Prepped with Chloraprep and Ethyl Chloride used for anesthesia. Under sterile conditions, patient injected into 1st CMC joint at joint line in apex of snuffbox inserting perpendicularly with traction placed on thumb. Aspiration yields no blood. Decreased pain post injection. No complications. Needle size: 25 gauge Injection: 1/2 cc of Lidocaine 1% and Depo-Medrol 20 mg   New Prescriptions   No medications on file   Modified Medications   Modified Medication Previous Medication   DONEPEZIL (ARICEPT) 10 MG TABLET donepezil (ARICEPT) 10 MG tablet      TAKE 1 TABLET AT BEDTIME    TAKE 1 TABLET AT BEDTIME AS NEEDED   Orders Placed This Encounter  Procedures  . Ambulatory referral to Orthopedic Surgery   Follow-up: No Follow-up on file. Unless noted above, the patient is to follow-up if symptoms worsen. Red flags were reviewed with the patient.  Signed,  Maud Deed. Amily Depp, MD, CAQ Sports Medicine   Discontinued Medications   LEVOFLOXACIN (LEVAQUIN) 750 MG TABLET    Take 750 mg by mouth daily.   METOCLOPRAMIDE (REGLAN) 10 MG TABLET    Take 10 mg by mouth 4 (four) times daily.   Current Medications at Discharge:   Medication List       This list is accurate as of: 04/05/14 11:59 PM.  Always use your most recent med list.               donepezil 10 MG tablet  Commonly known as:  ARICEPT  TAKE 1 TABLET AT BEDTIME     multivitamin tablet  Take 1 tablet by mouth daily.     sucralfate 1 GM/10ML suspension  Commonly known as:  CARAFATE  Take 10 mLs (1 g total) by mouth 2 (two) times daily.

## 2014-04-05 NOTE — Patient Instructions (Signed)
REFERRALS TO SPECIALISTS, SPECIAL TESTS (MRI, CT, ULTRASOUNDS)  GO THE WAITING ROOM AND TELL CHECK IN YOU NEED HELP WITH A REFERRAL. Either MARION or LINDA will help you set it up.  If it is between 1-2 PM they may be at lunch.  After 5 PM, they will likely be at home.  They will call you, so please make sure the office has your correct phone number.  Referrals sometimes can be done same day if urgent, but others can take 2 or 3 days to get an appointment. Starting in 2015, many of the new Medicare insurance plans and Affordable Care Act (Obamacare) Health plans offered take much longer for referrals. They have added additional paperwork and steps.  MRI's and CT's can take up to a week for the test. (Emergencies like strokes take precedence. I will tell you if you have an emergency.)   If your referral is to an in-network [facility] office, their office may contact you directly prior to our office reaching you.  -- Examples: [facility], [facility], [facility], [facility], [facility], and many more.  Specialist appointment times vary a great deal, mostly on the specialist's schedule and if they have openings. -- Our office tries to get you in as fast as possible. -- Some specialists have very long wait times. (Example. Dermatology. Usually months) -- If you have a true emergency like new cancer, we work to get you in ASAP.   

## 2014-04-07 DIAGNOSIS — M6530 Trigger finger, unspecified finger: Secondary | ICD-10-CM | POA: Insufficient documentation

## 2014-04-12 LAB — CULTURE, BLOOD (SINGLE)

## 2014-04-19 ENCOUNTER — Ambulatory Visit (INDEPENDENT_AMBULATORY_CARE_PROVIDER_SITE_OTHER)
Admission: RE | Admit: 2014-04-19 | Discharge: 2014-04-19 | Disposition: A | Discharge status: Home / Self Care | Payer: Medicare Other | Source: Ambulatory Visit | Attending: Internal Medicine | Admitting: Internal Medicine

## 2014-04-19 ENCOUNTER — Encounter: Payer: Self-pay | Admitting: Internal Medicine

## 2014-04-19 ENCOUNTER — Ambulatory Visit (INDEPENDENT_AMBULATORY_CARE_PROVIDER_SITE_OTHER): Payer: Medicare Other | Admitting: Internal Medicine

## 2014-04-19 VITALS — BP 118/60 | HR 57 | Temp 97.7°F | Wt 178.0 lb

## 2014-04-19 DIAGNOSIS — J189 Pneumonia, unspecified organism: Secondary | ICD-10-CM

## 2014-04-19 DIAGNOSIS — J449 Chronic obstructive pulmonary disease, unspecified: Secondary | ICD-10-CM | POA: Diagnosis not present

## 2014-04-19 DIAGNOSIS — J181 Lobar pneumonia, unspecified organism: Principal | ICD-10-CM

## 2014-04-19 NOTE — Progress Notes (Signed)
   Subjective:    Patient ID: Corey Clark, male    DOB: Feb 27, 1925, 80 y.o.   MRN: [id number]  HPI Here with wife  Feels better No more stomach trouble No fever  No cough No SOB  Current Outpatient Prescriptions on File Prior to Visit  Medication Sig Dispense Refill  . donepezil (ARICEPT) 10 MG tablet TAKE 1 TABLET AT BEDTIME      . Multiple Vitamin (MULTIVITAMIN) tablet Take 1 tablet by mouth daily.      . sucralfate (CARAFATE) 1 GM/10ML suspension Take 10 mLs (1 g total) by mouth 2 (two) times daily.  1680 mL  3   No current facility-administered medications on file prior to visit.    Allergies  Allergen Reactions  . Tetracyclines & Related Other (See Comments)    Patient not sure it's been years since he's taken med    Past Medical History  Diagnosis Date  . GERD (gastroesophageal reflux disease)   . Colon polyps   . Osteoarthrosis involving, or with mention of more than one site, but not specified as generalized, multiple sites   . Neuropathy due to drug     chemo for stomach cancer  . Stomach cancer 1999    MALT lymphoma --surgery then chemo. Bad infection after with 1 month hospitalization  . BPH (benign prostatic hypertrophy)   . 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 Partial gastrectomy  1999    2 procedures for stomach cancer  . Rotator cuff repair  1991/1998    left and right done  . Hemorrhoid surgery  1998 & 2000  . Cataract extraction, bilateral  2002, 2003    Family History  Problem Relation Age of Onset  . Cancer Brother   . Heart disease Neg Hx   . Hypertension Neg Hx   . Diabetes Neg Hx     History   Social History  . Marital Status: Married    Spouse Name: N/A    Number of Children: 3  . Years of Education: N/A   Occupational History  . 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 Smoking status: Former  Smoker    Quit date: 10/15/1950  . Smokeless tobacco: Never Used  . Alcohol Use: Yes     Comment: rare wine  . Drug Use: No  . Sexual Activity: Not on file   Other Topics Concern  . Not on file   Social History Narrative   3 children in [place]      Has living will    Wife, then son, Rector [place]onshire., to be health care POA   Would accept resuscitation attempts   Not sure about tube feeds-- probably doesn't want if cognitively unaware   Review of Systems Appetite is fine Bowels are okay     Objective:   Physical Exam  Constitutional: He appears well-developed and well-nourished. No distress.  Neck: Normal range of motion. Neck supple. No thyromegaly present.  Pulmonary/Chest: Effort normal. No respiratory distress. He has no wheezes.  Slight dry crackles at left base Clear otherwise  Abdominal: Soft. There is no tenderness.  Lymphadenopathy:    He has no cervical adenopathy.          Assessment & Plan:

## 2014-04-19 NOTE — Progress Notes (Signed)
Pre visit review using our clinic review tool, if applicable. No additional management support is needed unless otherwise documented below in the visit note. 

## 2014-04-19 NOTE — Assessment & Plan Note (Addendum)
Clinically resolved Will check CXR to be sure of resolution If any question, will follow up with chest CT

## 2014-04-20 ENCOUNTER — Other Ambulatory Visit: Payer: Self-pay | Admitting: Internal Medicine

## 2014-04-20 DIAGNOSIS — J984 Other disorders of lung: Secondary | ICD-10-CM

## 2014-04-23 ENCOUNTER — Other Ambulatory Visit: Payer: Medicare Other

## 2014-04-26 ENCOUNTER — Ambulatory Visit: Payer: Self-pay | Admitting: Internal Medicine

## 2014-04-26 DIAGNOSIS — J984 Other disorders of lung: Secondary | ICD-10-CM | POA: Diagnosis not present

## 2014-04-27 ENCOUNTER — Encounter: Payer: Self-pay | Admitting: Internal Medicine

## 2014-05-10 DIAGNOSIS — Z9889 Other specified postprocedural states: Secondary | ICD-10-CM | POA: Insufficient documentation

## 2014-05-13 ENCOUNTER — Encounter: Payer: Self-pay | Admitting: Unknown Physician Specialty

## 2014-05-13 DIAGNOSIS — M25649 Stiffness of unspecified hand, not elsewhere classified: Secondary | ICD-10-CM | POA: Diagnosis not present

## 2014-05-13 DIAGNOSIS — L905 Scar conditions and fibrosis of skin: Secondary | ICD-10-CM | POA: Diagnosis not present

## 2014-05-13 DIAGNOSIS — IMO0001 Reserved for inherently not codable concepts without codable children: Secondary | ICD-10-CM | POA: Diagnosis not present

## 2014-05-13 DIAGNOSIS — M25549 Pain in joints of unspecified hand: Secondary | ICD-10-CM | POA: Diagnosis not present

## 2014-05-14 ENCOUNTER — Ambulatory Visit: Payer: Medicare Other | Admitting: Podiatry

## 2014-05-15 ENCOUNTER — Encounter: Payer: Self-pay | Admitting: Unknown Physician Specialty

## 2014-05-15 DIAGNOSIS — IMO0001 Reserved for inherently not codable concepts without codable children: Secondary | ICD-10-CM | POA: Diagnosis not present

## 2014-05-15 DIAGNOSIS — M25549 Pain in joints of unspecified hand: Secondary | ICD-10-CM | POA: Diagnosis not present

## 2014-05-15 DIAGNOSIS — L905 Scar conditions and fibrosis of skin: Secondary | ICD-10-CM | POA: Diagnosis not present

## 2014-05-15 DIAGNOSIS — M25649 Stiffness of unspecified hand, not elsewhere classified: Secondary | ICD-10-CM | POA: Diagnosis not present

## 2014-05-21 ENCOUNTER — Ambulatory Visit (INDEPENDENT_AMBULATORY_CARE_PROVIDER_SITE_OTHER): Payer: Medicare Other | Admitting: Podiatry

## 2014-05-21 DIAGNOSIS — M79676 Pain in unspecified toe(s): Secondary | ICD-10-CM

## 2014-05-21 DIAGNOSIS — M79609 Pain in unspecified limb: Secondary | ICD-10-CM

## 2014-05-21 DIAGNOSIS — B351 Tinea unguium: Secondary | ICD-10-CM | POA: Diagnosis not present

## 2014-05-21 NOTE — Progress Notes (Signed)
Subjective:     Patient ID: Corey Clark, male   DOB: [birth year], 80 y.o.   MRN: [id number]  HPI patient presents stating my nails are thick and they bother me and I cannot cut them myself   Review of Systems     Objective:   Physical Exam Neurovascular status intact with thick yellow brittle nailbeds 1-5 both feet    Assessment:     Mycotic nail infection is with pain 1-5 both feet    Plan:

## 2014-06-02 DIAGNOSIS — C4491 Basal cell carcinoma of skin, unspecified: Secondary | ICD-10-CM | POA: Diagnosis not present

## 2014-06-15 ENCOUNTER — Encounter: Payer: Self-pay | Admitting: Unknown Physician Specialty

## 2014-06-28 DIAGNOSIS — L819 Disorder of pigmentation, unspecified: Secondary | ICD-10-CM | POA: Diagnosis not present

## 2014-06-28 DIAGNOSIS — L259 Unspecified contact dermatitis, unspecified cause: Secondary | ICD-10-CM | POA: Diagnosis not present

## 2014-06-28 DIAGNOSIS — L570 Actinic keratosis: Secondary | ICD-10-CM | POA: Diagnosis not present

## 2014-06-28 DIAGNOSIS — L578 Other skin changes due to chronic exposure to nonionizing radiation: Secondary | ICD-10-CM | POA: Diagnosis not present

## 2014-06-28 DIAGNOSIS — Z85828 Personal history of other malignant neoplasm of skin: Secondary | ICD-10-CM | POA: Diagnosis not present

## 2014-06-28 DIAGNOSIS — L821 Other seborrheic keratosis: Secondary | ICD-10-CM | POA: Diagnosis not present

## 2014-06-28 DIAGNOSIS — L723 Sebaceous cyst: Secondary | ICD-10-CM | POA: Diagnosis not present

## 2014-06-29 ENCOUNTER — Encounter: Payer: Self-pay | Admitting: Internal Medicine

## 2014-06-29 ENCOUNTER — Ambulatory Visit (INDEPENDENT_AMBULATORY_CARE_PROVIDER_SITE_OTHER): Payer: Medicare Other | Admitting: Internal Medicine

## 2014-06-29 ENCOUNTER — Ambulatory Visit (INDEPENDENT_AMBULATORY_CARE_PROVIDER_SITE_OTHER)
Admission: RE | Admit: 2014-06-29 | Discharge: 2014-06-29 | Disposition: A | Discharge status: Home / Self Care | Payer: Medicare Other | Source: Ambulatory Visit | Attending: Internal Medicine | Admitting: Internal Medicine

## 2014-06-29 VITALS — BP 110/60 | HR 74 | Temp 97.5°F | Wt 181.0 lb

## 2014-06-29 DIAGNOSIS — R918 Other nonspecific abnormal finding of lung field: Secondary | ICD-10-CM | POA: Diagnosis not present

## 2014-06-29 DIAGNOSIS — Z23 Encounter for immunization: Secondary | ICD-10-CM | POA: Diagnosis not present

## 2014-06-29 DIAGNOSIS — G3184 Mild cognitive impairment, so stated: Secondary | ICD-10-CM | POA: Diagnosis not present

## 2014-06-29 DIAGNOSIS — J310 Chronic rhinitis: Secondary | ICD-10-CM | POA: Insufficient documentation

## 2014-06-29 DIAGNOSIS — I517 Cardiomegaly: Secondary | ICD-10-CM | POA: Diagnosis not present

## 2014-06-29 NOTE — Progress Notes (Signed)
Subjective:    Patient ID: Corey Clark, male    DOB: [birth year], 80 y.o.   MRN: [id number]  HPI Here with wife  No cough No respiratory difficulty Ongoing drippy nose and post nasal drip---not worse with ragweed (goes back 2 years) This does bother him  flonase didn't help Doesn't remember trying antihistamines  Memory still poor Wife feels he is stable Still does the cooking Hasn't given up any tasks Doesn't got lost---still drives on his own. May have trouble changing course or multiple destinations (?executive function)  Appetite is good No weight loss Reviewed CT findings  Current Outpatient Prescriptions on File Prior to Visit  Medication Sig Dispense Refill  . donepezil (ARICEPT) 10 MG tablet TAKE 1 TABLET AT BEDTIME      . Multiple Vitamin (MULTIVITAMIN) tablet Take 1 tablet by mouth daily.      . sucralfate (CARAFATE) 1 GM/10ML suspension Take 10 mLs (1 g total) by mouth 2 (two) times daily.  1680 mL  3   No current facility-administered medications on file prior to visit.    Allergies  Allergen Reactions  . Tetracyclines & Related Other (See Comments)    Patient not sure it's been years since he's taken med    Past Medical History  Diagnosis Date  . GERD (gastroesophageal reflux disease)   . Colon polyps   . Osteoarthrosis involving, or with mention of more than one site, but not specified as generalized, multiple sites   . Neuropathy due to drug     chemo for stomach cancer  . Stomach cancer 1999    MALT lymphoma --surgery then chemo. Bad infection after with 1 month hospitalization  . BPH (benign prostatic hypertrophy)   . 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 Partial gastrectomy  1999    2 procedures for stomach cancer  . Rotator cuff repair  1991/1998    left and right done  . Hemorrhoid surgery  1998 & 2000  . Cataract extraction, bilateral  2002,  2003  . Tendon release Left 6/15    Dr Jefm Bryant    Family History  Problem Relation Age of Onset  . Cancer Brother   . Heart disease Neg Hx   . Hypertension Neg Hx   . Diabetes Neg Hx     History   Social History  . Marital Status: Married    Spouse Name: N/A    Number of Children: 3  . Years of Education: N/A   Occupational History  . 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 Smoking status: Former Smoker    Quit date: 10/15/1950  . Smokeless tobacco: Never Used  . Alcohol Use: Yes     Comment: rare wine  . Drug Use: No  . Sexual Activity: Not on file   Other Topics Concern  . Not on file   Social History Narrative   3 children in [place]      Has living will    Wife, then son, Darry Kelnhofer., to be health care POA   Would accept resuscitation attempts   Not sure about tube feeds-- probably doesn't want if cognitively unaware   Review of Systems Derm visit yesterday-- multiple lesions treated and prescription for top of head     Objective:   Physical Exam  Constitutional: He appears well-developed and well-nourished.  No distress.  HENT:  Moderate pale nasal swelling  Neck: Normal range of motion. Neck supple. No thyromegaly present.  Cardiovascular: Normal rate, regular rhythm and normal heart sounds.   Pulmonary/Chest: Effort normal and breath sounds normal. No respiratory distress. He has no wheezes. He has no rales.  Lymphadenopathy:    He has no cervical adenopathy.  Psychiatric: He has a normal mood and affect. His behavior is normal.          Assessment & Plan:

## 2014-06-29 NOTE — Addendum Note (Signed)
Addended by: Despina Hidden on: 06/29/2014 10:53 AM   Modules accepted: Orders

## 2014-06-29 NOTE — Progress Notes (Signed)
Pre visit review using our clinic review tool, if applicable. No additional management support is needed unless otherwise documented below in the visit note. 

## 2014-06-29 NOTE — Patient Instructions (Signed)
Please try over the counter loratadine 10mg  once a day or twice a day. If that doesn't help the runny nose, call and I will prescribe another spray to try for your nose.

## 2014-06-29 NOTE — Assessment & Plan Note (Signed)
Mild memory and executive function issues No apparent progression per wife and him No Rx needed

## 2014-06-29 NOTE — Assessment & Plan Note (Signed)
Will try loratadine---if ineffective, will change to ipratropium spray

## 2014-06-29 NOTE — Assessment & Plan Note (Signed)
Probably scarring Discussed options about follow up--none, CT, CXR only Will proceed with just CXR

## 2014-07-09 ENCOUNTER — Ambulatory Visit: Payer: Medicare Other | Admitting: Internal Medicine

## 2014-07-10 ENCOUNTER — Other Ambulatory Visit: Payer: Self-pay | Admitting: Internal Medicine

## 2014-07-29 ENCOUNTER — Encounter: Payer: Self-pay | Admitting: Internal Medicine

## 2014-07-29 MED ORDER — SUCRALFATE 1 GM/10ML PO SUSP: 1.0000 g | Freq: Two times a day (BID) | ORAL | Status: DC

## 2014-07-29 NOTE — Telephone Encounter (Signed)
MyChart Rx request for Carafate received. Patient last office visit was 06/29/14 and medication last filled 07/14/12. Please advise

## 2014-08-13 ENCOUNTER — Other Ambulatory Visit: Payer: Medicare Other

## 2014-08-20 ENCOUNTER — Ambulatory Visit (INDEPENDENT_AMBULATORY_CARE_PROVIDER_SITE_OTHER): Payer: Medicare Other | Admitting: Podiatry

## 2014-08-20 DIAGNOSIS — M79676 Pain in unspecified toe(s): Secondary | ICD-10-CM | POA: Diagnosis not present

## 2014-08-20 DIAGNOSIS — B351 Tinea unguium: Secondary | ICD-10-CM | POA: Diagnosis not present

## 2014-08-20 NOTE — Progress Notes (Signed)
Subjective:  °  ° Patient ID: Corey Clark, male   DOB: 12/21/1924, 80 y.o.   MRN: [id number] ° °HPIpatient presents with nail disease 1-5 both feet that bother him and he cannot cut ° ° °Review of Systems ° °   °Objective:  ° Physical Exam °Neurovascular status intact with thick yellow brittle nailbeds 1-5 both feet °   °Assessment:  °   °Mycotic nail infections with pain 1-5 both feet °   °Plan:  °   °Debride painful nailbeds 1-5 both feet with no iatrogenic bleeding noted °   ° ° °

## 2014-10-29 ENCOUNTER — Ambulatory Visit (INDEPENDENT_AMBULATORY_CARE_PROVIDER_SITE_OTHER): Payer: Medicare Other | Admitting: Podiatry

## 2014-10-29 DIAGNOSIS — M79676 Pain in unspecified toe(s): Secondary | ICD-10-CM | POA: Diagnosis not present

## 2014-10-29 DIAGNOSIS — B351 Tinea unguium: Secondary | ICD-10-CM | POA: Diagnosis not present

## 2014-10-29 NOTE — Progress Notes (Signed)
Subjective:     Patient ID: Corey Clark, male   DOB: [birth year], 81 y.o.   MRN: [id number]  HPIpatient presents with nail disease 1-5 both feet that bother him and he cannot cut   Review of Systems     Objective:   Physical Exam Neurovascular status intact with thick yellow brittle nailbeds 1-5 both feet    Assessment:     Mycotic nail infections with pain 1-5 both feet    Plan:     Debride painful nailbeds 1-5 both feet with no iatrogenic bleeding noted

## 2015-01-07 ENCOUNTER — Encounter: Payer: Medicare Other | Admitting: Internal Medicine

## 2015-01-10 ENCOUNTER — Other Ambulatory Visit: Payer: Self-pay | Admitting: Internal Medicine

## 2015-01-11 ENCOUNTER — Encounter: Payer: Medicare Other | Admitting: Internal Medicine

## 2015-01-17 ENCOUNTER — Encounter: Payer: Self-pay | Admitting: Internal Medicine

## 2015-01-17 ENCOUNTER — Ambulatory Visit (INDEPENDENT_AMBULATORY_CARE_PROVIDER_SITE_OTHER): Payer: Medicare Other | Admitting: Internal Medicine

## 2015-01-17 VITALS — BP 114/62 | HR 60 | Temp 97.5°F | Resp 18 | Ht 69.5 in | Wt 180.8 lb

## 2015-01-17 DIAGNOSIS — Z79899 Other long term (current) drug therapy: Secondary | ICD-10-CM

## 2015-01-17 DIAGNOSIS — Z7189 Other specified counseling: Secondary | ICD-10-CM

## 2015-01-17 DIAGNOSIS — N400 Enlarged prostate without lower urinary tract symptoms: Secondary | ICD-10-CM

## 2015-01-17 DIAGNOSIS — G3184 Mild cognitive impairment, so stated: Secondary | ICD-10-CM

## 2015-01-17 DIAGNOSIS — Z0000 Encounter for general adult medical examination without abnormal findings: Secondary | ICD-10-CM

## 2015-01-17 DIAGNOSIS — K219 Gastro-esophageal reflux disease without esophagitis: Secondary | ICD-10-CM | POA: Diagnosis not present

## 2015-01-17 DIAGNOSIS — G620 Drug-induced polyneuropathy: Secondary | ICD-10-CM

## 2015-01-17 LAB — CBC WITH DIFFERENTIAL/PLATELET
BASOS ABS: 0 10*3/uL (ref 0.0–0.1)
BASOS PCT: 0.5 % (ref 0.0–3.0)
EOS ABS: 0.3 10*3/uL (ref 0.0–0.7)
Eosinophils Relative: 5.1 % — ABNORMAL HIGH (ref 0.0–5.0)
HEMATOCRIT: 42 % (ref 39.0–52.0)
HEMOGLOBIN: 14 g/dL (ref 13.0–17.0)
LYMPHS ABS: 1.9 10*3/uL (ref 0.7–4.0)
Lymphocytes Relative: 32.3 % (ref 12.0–46.0)
MCHC: 33.4 g/dL (ref 30.0–36.0)
MCV: 89.8 fl (ref 78.0–100.0)
MONO ABS: 0.8 10*3/uL (ref 0.1–1.0)
Monocytes Relative: 13.8 % — ABNORMAL HIGH (ref 3.0–12.0)
NEUTROS ABS: 2.8 10*3/uL (ref 1.4–7.7)
Neutrophils Relative %: 48.3 % (ref 43.0–77.0)
Platelets: 156 10*3/uL (ref 150.0–400.0)
RBC: 4.68 Mil/uL (ref 4.22–5.81)
RDW: 14.3 % (ref 11.5–15.5)
WBC: 5.7 10*3/uL (ref 4.0–10.5)

## 2015-01-17 LAB — COMPREHENSIVE METABOLIC PANEL
ALK PHOS: 81 U/L (ref 39–117)
ALT: 14 U/L (ref 0–53)
AST: 29 U/L (ref 0–37)
Albumin: 3.5 g/dL (ref 3.5–5.2)
BUN: 18 mg/dL (ref 6–23)
CO2: 31 mEq/L (ref 19–32)
CREATININE: 1.11 mg/dL (ref 0.40–1.50)
Calcium: 9.4 mg/dL (ref 8.4–10.5)
Chloride: 103 mEq/L (ref 96–112)
GFR: 66.51 mL/min (ref >60.00)
GLUCOSE: 80 mg/dL (ref 70–99)
Potassium: 4.5 mEq/L (ref 3.5–5.1)
Sodium: 138 mEq/L (ref 135–145)
Total Bilirubin: 0.8 mg/dL (ref 0.2–1.2)
Total Protein: 7.5 g/dL (ref 6.0–8.3)

## 2015-01-17 NOTE — Assessment & Plan Note (Signed)
Slight slipping per wife but no functional decline Will continue the donepezil

## 2015-01-17 NOTE — Assessment & Plan Note (Signed)
Due to past surgery On sucralfate

## 2015-01-17 NOTE — Assessment & Plan Note (Signed)
From chemo Reason for imbalance issues Again urged him to use a cane and get more active

## 2015-01-17 NOTE — Assessment & Plan Note (Signed)
See social history 

## 2015-01-17 NOTE — Progress Notes (Signed)
Subjective:    Patient ID: Corey Clark, male    DOB: 01-28-1925, 81 y.o.   MRN: [id number]  HPI Here for Medicare wellness and follow up of multiple medical issues Reviewed form and advanced directives Wife is hear with him Reviewed other physicians Occasional alcohol--at most one a day Not doing any exercise--discussed starting again  No falls Does have occasional down feelings. No anhedonia though Vision is okay. Hearing not great--getting hearing test and aides being adjusted  Memory seems about the same Wife notes some progression but mild Still drives---not getting lost (but stays local and has to go only one place at a time) Still does the cooking Physically he feels that he is limited by his balance issues. Doesn't walk new dog due to this  Voids okay Nocturia stable at 1-2 No daytime frequency or urgency. Does empty his bladder before going out  Stomach has been fine Still on sucralfate Will rarely have some "sticking" with pills and water. No problems with food Clears his throat a lot  Discussed the abnormal CXR No symptoms of concern now  Current Outpatient Prescriptions on File Prior to Visit  Medication Sig Dispense Refill  . donepezil (ARICEPT) 10 MG tablet TAKE 1 TABLET AT BEDTIME AS NEEDED 90 tablet 0  . Multiple Vitamin (MULTIVITAMIN) tablet Take 1 tablet by mouth daily.    . sucralfate (CARAFATE) 1 GM/10ML suspension Take 10 mLs (1 g total) by mouth 2 (two) times daily. 1680 mL 3  . triamcinolone cream (KENALOG) 0.1 %      No current facility-administered medications on file prior to visit.    Allergies  Allergen Reactions  . Tetracyclines & Related Other (See Comments)    Patient not sure it's been years since he's taken med    Past Medical History  Diagnosis Date  . GERD (gastroesophageal reflux disease)   . Colon polyps   . Osteoarthrosis involving, or with mention of more than one site, but not specified as generalized, multiple  sites   . Neuropathy due to drug     chemo for stomach cancer  . Stomach cancer 1999    MALT lymphoma --surgery then chemo. Bad infection after with 1 month hospitalization  . BPH (benign prostatic hypertrophy)   . 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 Partial gastrectomy  1999    2 procedures for stomach cancer  . Rotator cuff repair  1991/1998    left and right done  . Hemorrhoid surgery  1998 & 2000  . Cataract extraction, bilateral  2002, 2003  . Tendon release Left 6/15    Dr Jefm Bryant    Family History  Problem Relation Age of Onset  . Cancer Brother   . Heart disease Neg Hx   . Hypertension Neg Hx   . Diabetes Neg Hx     History   Social History  . Marital Status: Married    Spouse Name: N/A  . Number of Children: 3  . Years of Education: N/A   Occupational History  . 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 Smoking status: Former Smoker    Quit date: 10/15/1950  . Smokeless tobacco: Never Used  . Alcohol Use: Yes     Comment: rare wine  . Drug Use: No  . Sexual Activity: Not on file   Other Topics  Concern  . Not on file   Social History Narrative   3 children in [place]      Has living will    Wife, then son, Jaze Rodino., to be health care POA   Would accept resuscitation attempts   Not sure about tube feeds-- probably doesn't want if cognitively unaware   Review of Systems Does have clear rhinorrhea at times Runny eyes at times also Appetite is good Weight is stable No cough or breathing problems Wears seat belt Keeps up with dentist Having some soreness at left CMC--got injection which helped only a few days. Did have repair of left 3rd trigger finger in the past    Objective:   Physical Exam  Constitutional: He appears well-developed and well-nourished. No distress.  HENT:  Mouth/Throat: Oropharynx is  clear and moist.  Neck: Normal range of motion. Neck supple. No thyromegaly present.  Cardiovascular: Normal rate, regular rhythm, normal heart sounds and intact distal pulses.  Exam reveals no gallop.   No murmur heard. Pulmonary/Chest: Effort normal. No respiratory distress. He has no wheezes.  Slight dry crackles at left base  Abdominal: Soft. There is no tenderness.  Musculoskeletal: He exhibits no edema or tenderness.  Lymphadenopathy:    He has no cervical adenopathy.  Neurological:  Normal interaction and speech  Skin: No rash noted.  Mycotic toenails  Psychiatric: He has a normal mood and affect. His behavior is normal.          Assessment & Plan:

## 2015-01-17 NOTE — Patient Instructions (Signed)
Please don't wear your glasses when walking. You need to exercise more--walk with a cane, go to the gym and use the machines or walk/swim in a pool!!

## 2015-01-17 NOTE — Assessment & Plan Note (Signed)
Mild symptoms ?No Rx needed ?

## 2015-01-17 NOTE — Assessment & Plan Note (Signed)
I have personally reviewed the Medicare Annual Wellness questionnaire and have noted 1. The patient's medical and social history 2. Their use of alcohol, tobacco or illicit drugs 3. Their current medications and supplements 4. The patient's functional ability including ADL's, fall risks, home safety risks and hearing or visual             impairment. 5. Diet and physical activities 6. Evidence for depression or mood disorders  The patients weight, height, BMI and visual acuity have been recorded in the chart I have made referrals, counseling and provided education to the patient based review of the above and I have provided the pt with a written personalized care plan for preventive services.  I have provided you with a copy of your personalized plan for preventive services. Please take the time to review along with your updated medication list.  UTD on all immunizations No cancer screening due to age Discussed increasing exercise

## 2015-01-18 LAB — T4, FREE: FREE T4: 0.96 ng/dL (ref 0.60–1.60)

## 2015-01-28 ENCOUNTER — Ambulatory Visit (INDEPENDENT_AMBULATORY_CARE_PROVIDER_SITE_OTHER): Payer: Medicare Other | Admitting: Podiatry

## 2015-01-28 DIAGNOSIS — B351 Tinea unguium: Secondary | ICD-10-CM | POA: Diagnosis not present

## 2015-01-28 DIAGNOSIS — M79676 Pain in unspecified toe(s): Secondary | ICD-10-CM | POA: Diagnosis not present

## 2015-01-28 NOTE — Progress Notes (Signed)
Subjective:     Patient ID: Corey Clark, male   DOB: 01/25/1925, 81 y.o.   MRN: [id number]  HPIpatient presents with nail disease 1-5 both feet that bother him and he cannot cut   Review of Systems     Objective:   Physical Exam Neurovascular status intact with thick yellow brittle nailbeds 1-5 both feet    Assessment:     Mycotic nail infections with pain 1-5 both feet    Plan:     Debride painful nailbeds 1-5 both feet with no iatrogenic bleeding noted

## 2015-04-08 ENCOUNTER — Other Ambulatory Visit: Payer: Self-pay | Admitting: Internal Medicine

## 2015-04-14 IMAGING — CR DG CHEST 2V
2 series · 2 of 2 positions shown · non-contrast
Comparison: CT abdomen pelvis 03/21/2014

CLINICAL DATA: Follow-up right middle lobe pneumonia.  Abnormal CT

EXAM:
CHEST  2 VIEW

[view not recorded (1 of 2)]
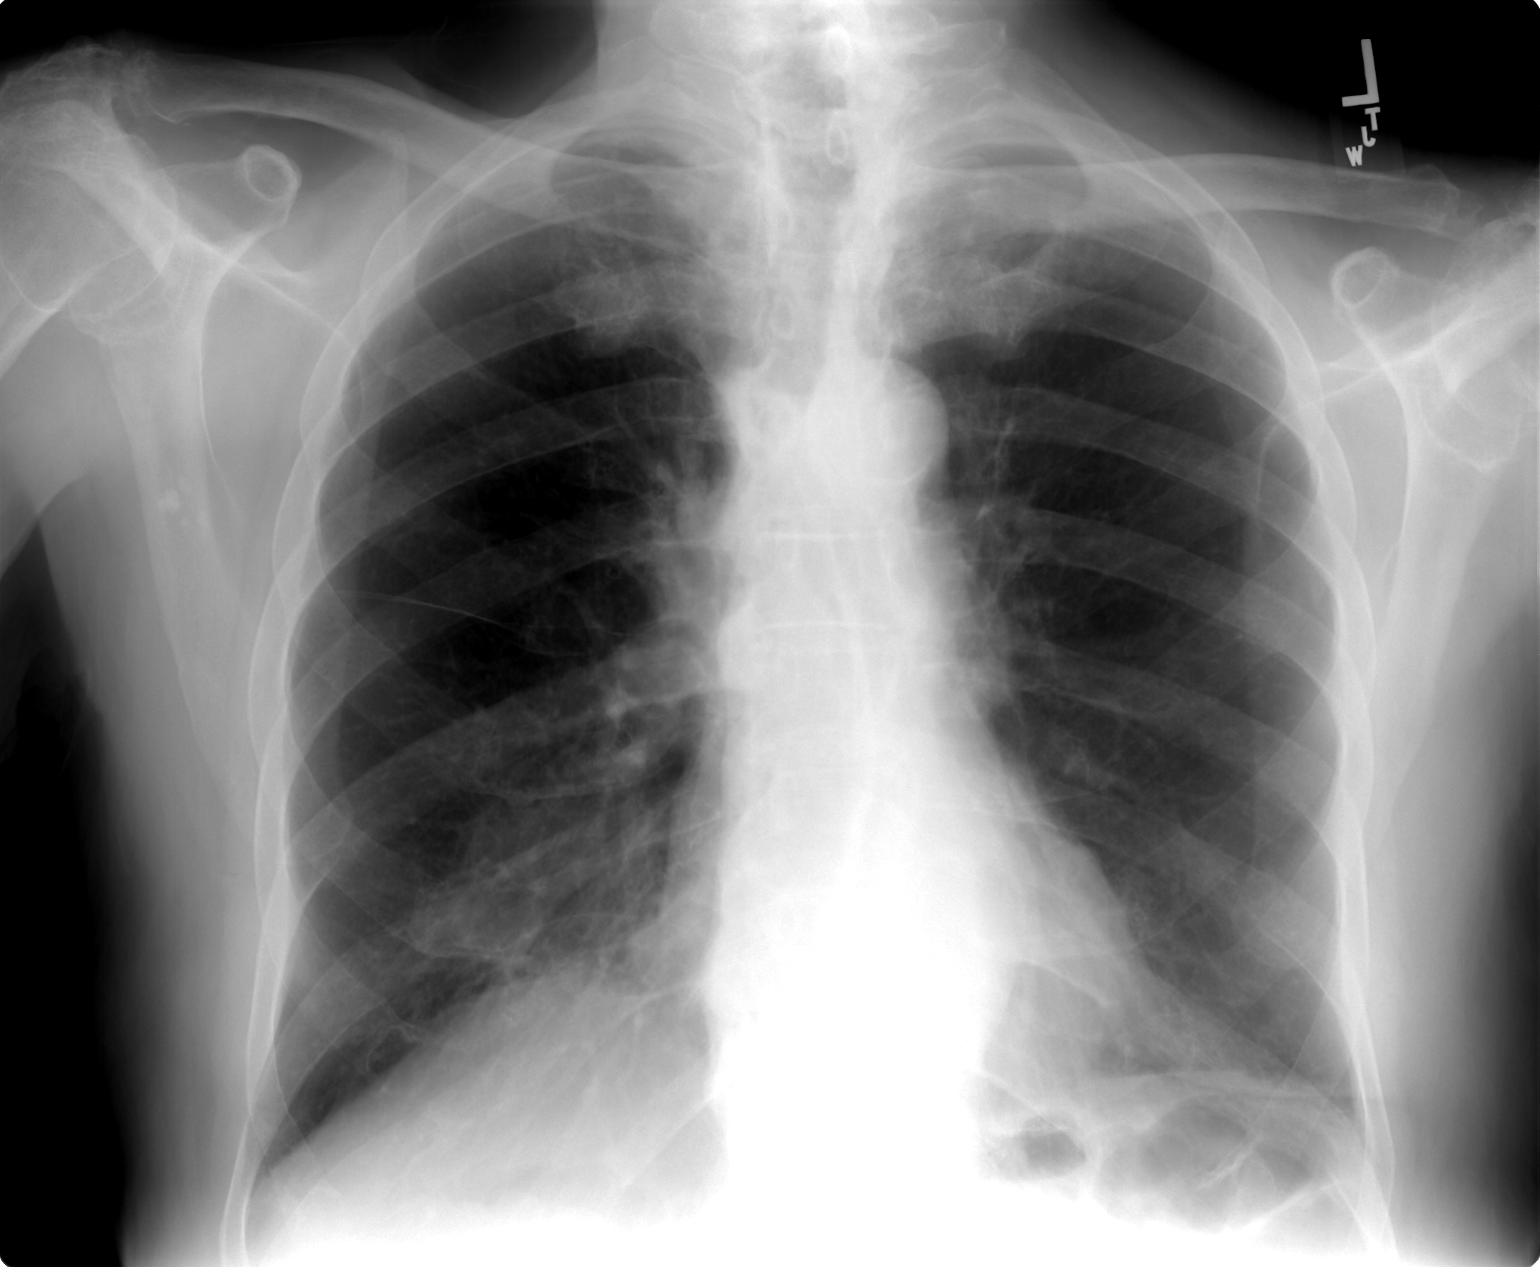

[view not recorded (2 of 2)]
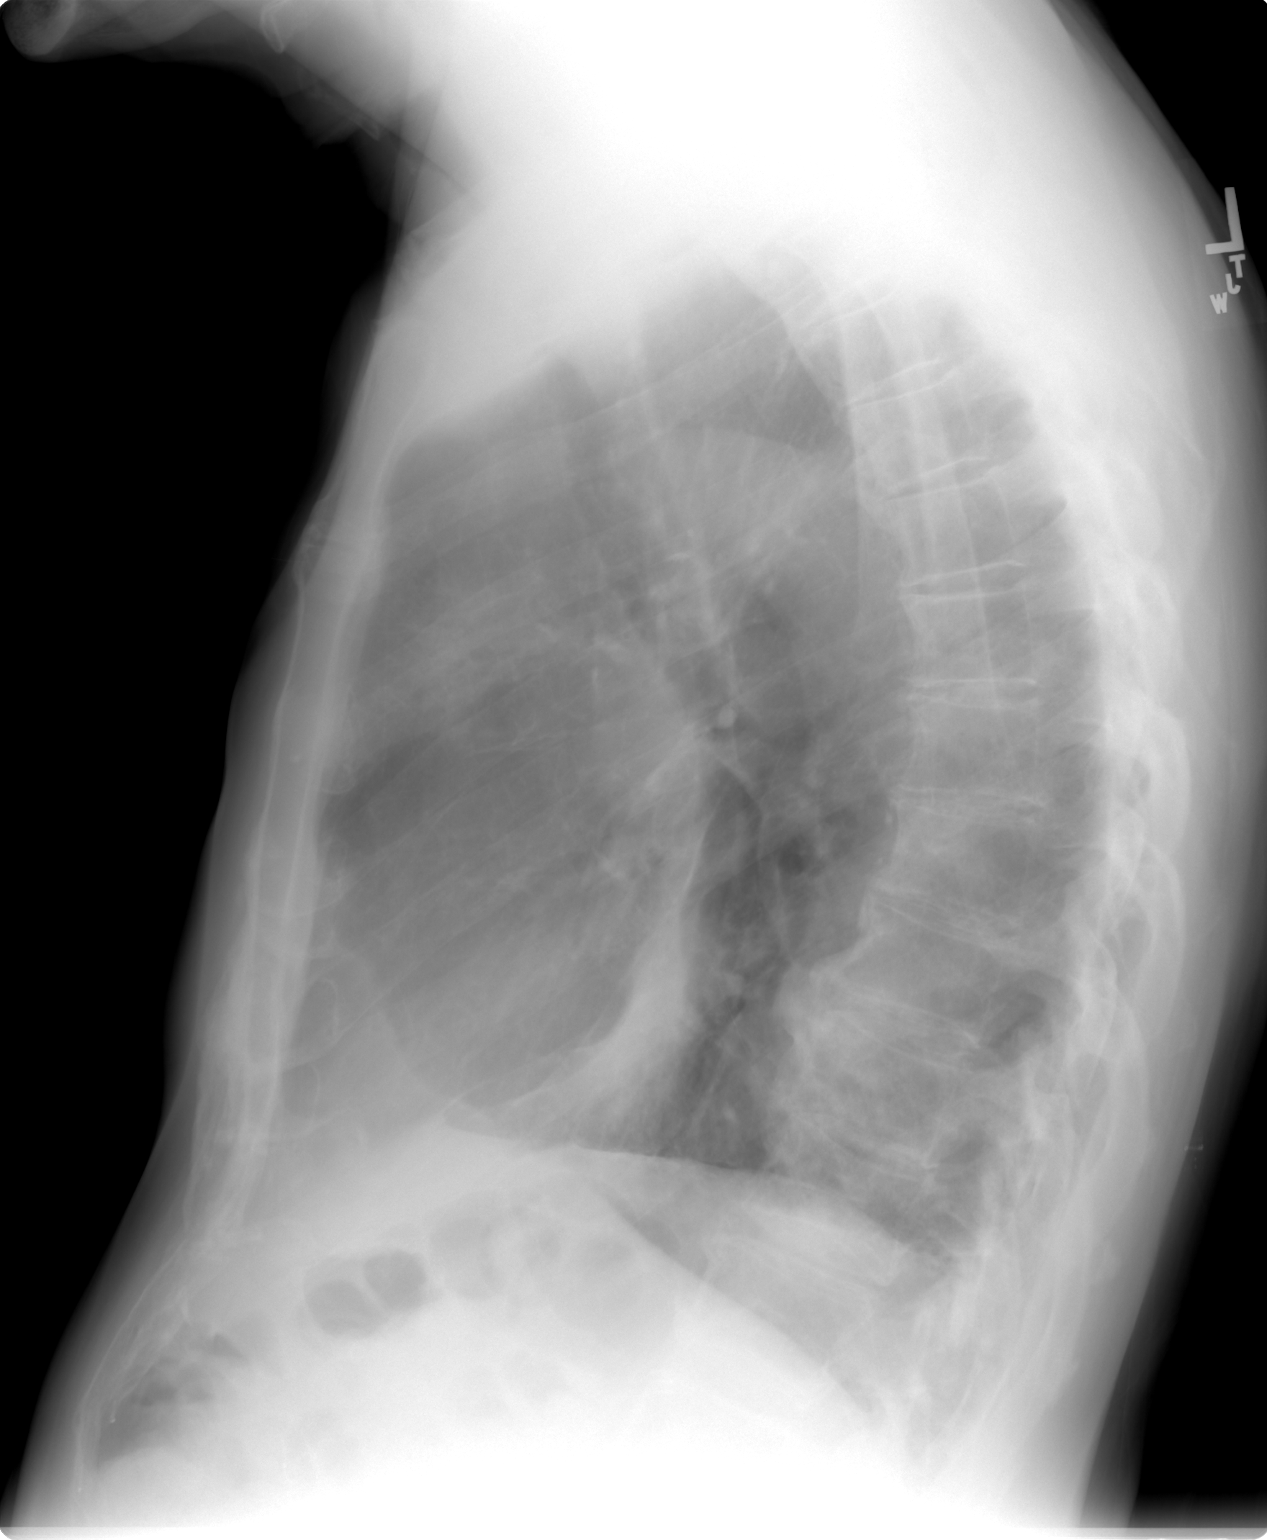

[2 of 2 positions shown; findings below may reference images not displayed]

FINDINGS: COPD with pulmonary hyperinflation.

Persistent right lower lobe density as noted on the CT. This is
ill-defined and could represent persistent pneumonia or a mass
lesion. No pleural effusion.

Left lung is clear. Heart size is normal. Negative for heart
failure.
IMPRESSION: Persistent right lower lobe airspace density. Differential includes
carcinoma of the lung and persistent pneumonia. CT chest with
contrast suggested for further evaluation to rule out neoplasm.

COPD

## 2015-04-19 ENCOUNTER — Ambulatory Visit: Payer: Medicare Other

## 2015-04-21 IMAGING — CT CT CHEST W/ CM
2 series · 15 of 34 positions shown, 18 images · IV contrast (isovue)
Comparison: 04/19/2014 and 03/21/2014 chest x-ray. 03/21/2014
abdominal and pelvic CT.

CLINICAL DATA: Abnormal chest x-ray. History of stomach cancer 6222
post chemotherapy and radiation therapy.

EXAM:
CT CHEST WITH CONTRAST
TECHNIQUE: Multidetector CT imaging of the chest was performed during
intravenous contrast administration.
CONTRAST:  70 cc Isovue 370.

[Series 2: routine chest with · axial · 0.63mm/px · z∈[-574,-274]mm · 12 of 72 slices shown, 15 images]
[im 6/72  mediastinal]
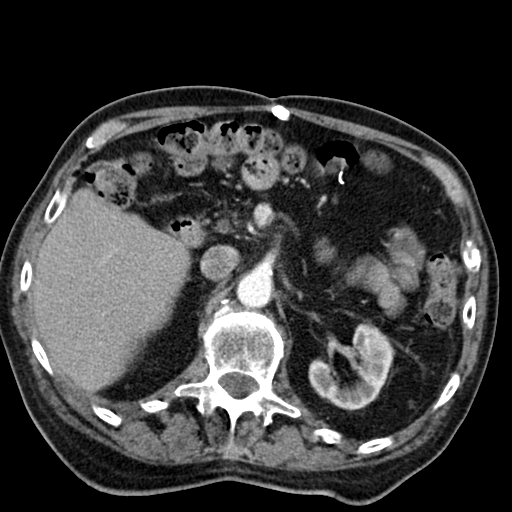
[im 6/72  lung]
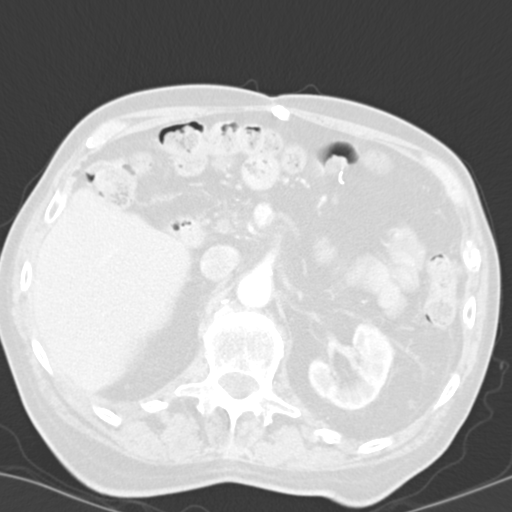
[im 11/72  lung]
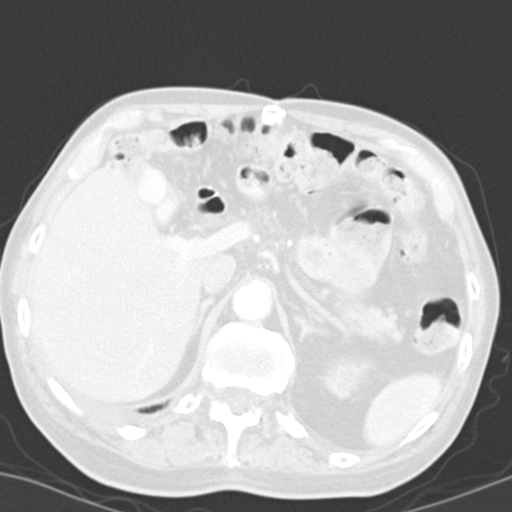
[im 16/72  lung]
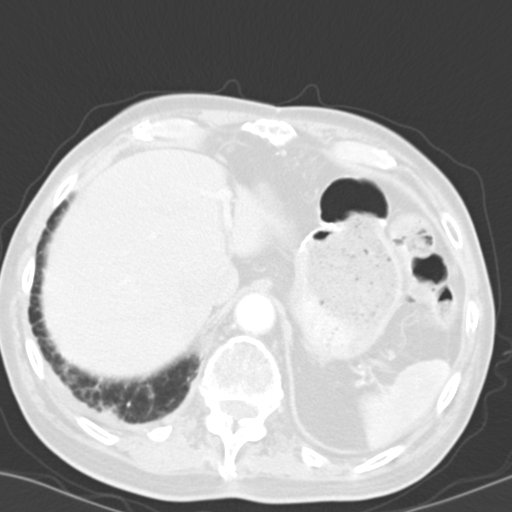
[im 22/72  lung]
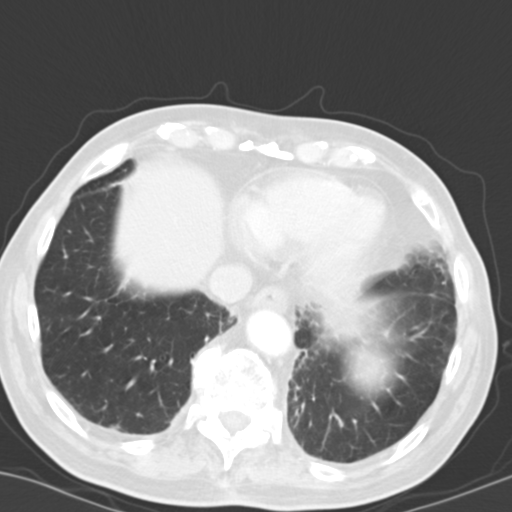
[im 27/72  mediastinal]
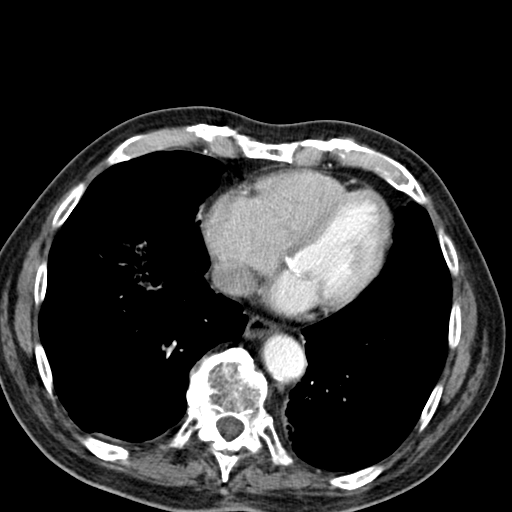
[im 27/72  lung]
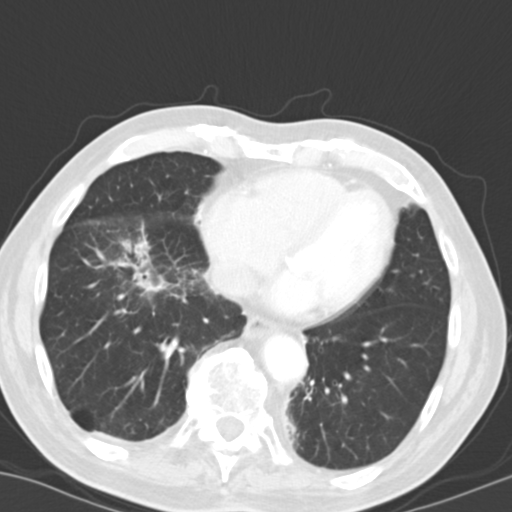
[im 32/72  lung]
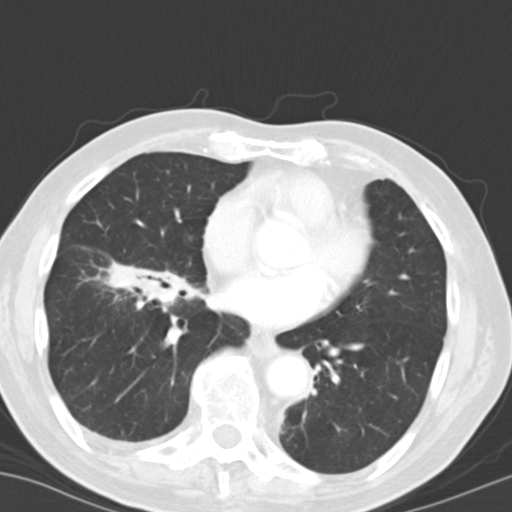
[im 40/72  lung]
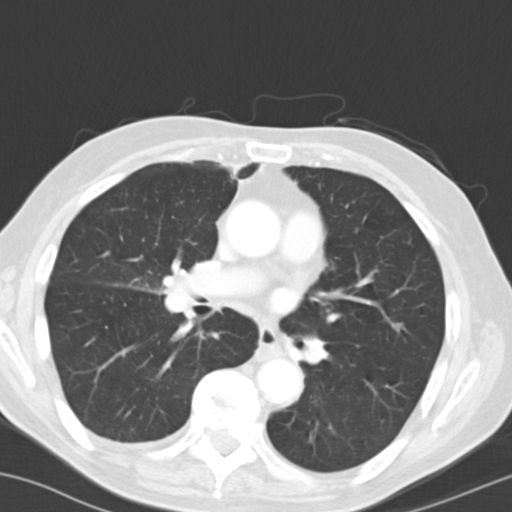
[im 45/72  lung]
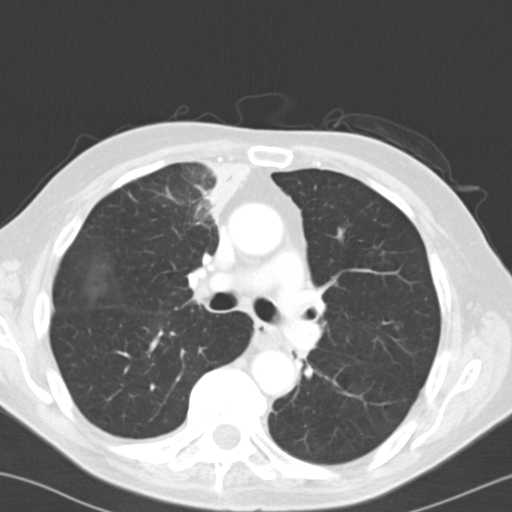
[im 50/72  mediastinal]
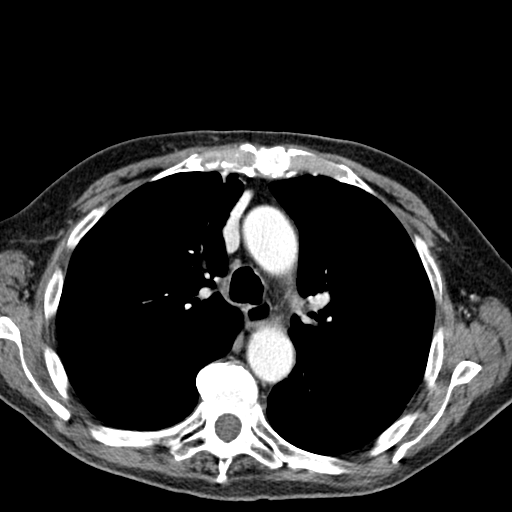
[im 50/72  lung]
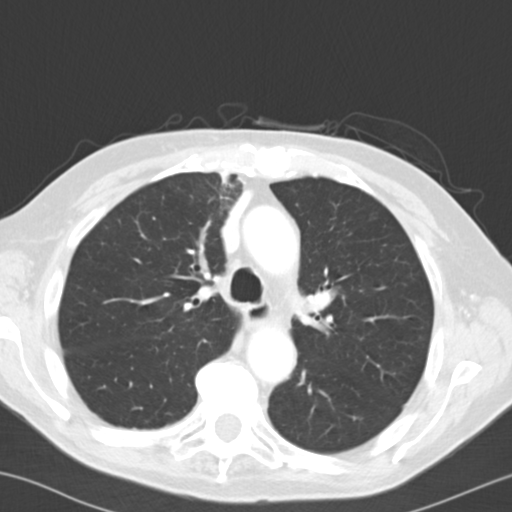
[im 56/72  lung]
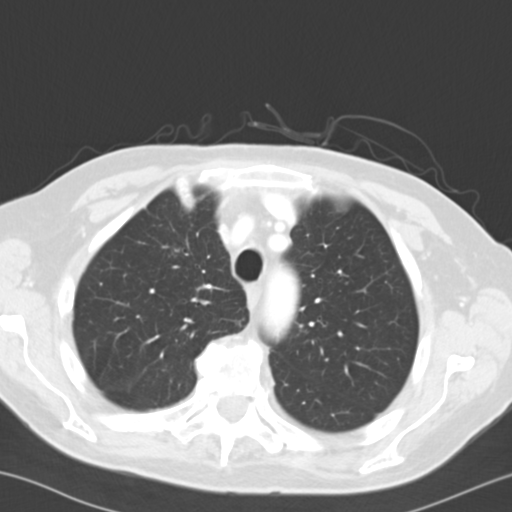
[im 61/72  lung]
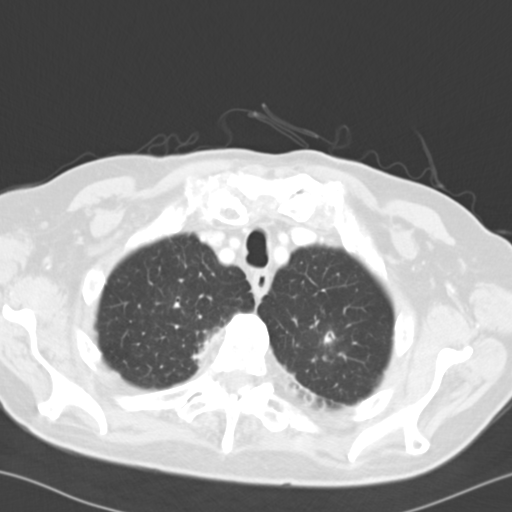
[im 66/72  lung]
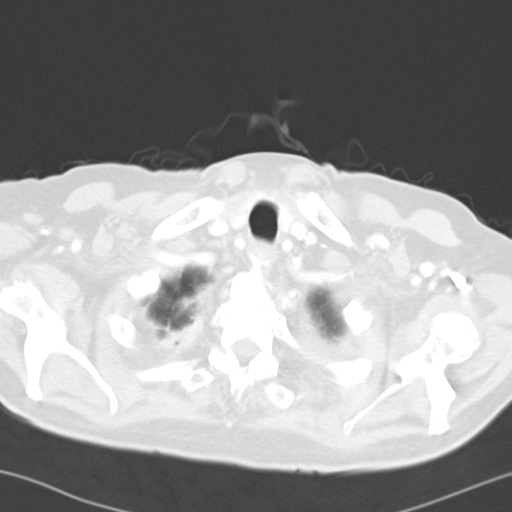

[Series 5: cor routine chest with · coronal · 0.66mm/px · 3 of 130 slices shown]
[im 26/130  lung]
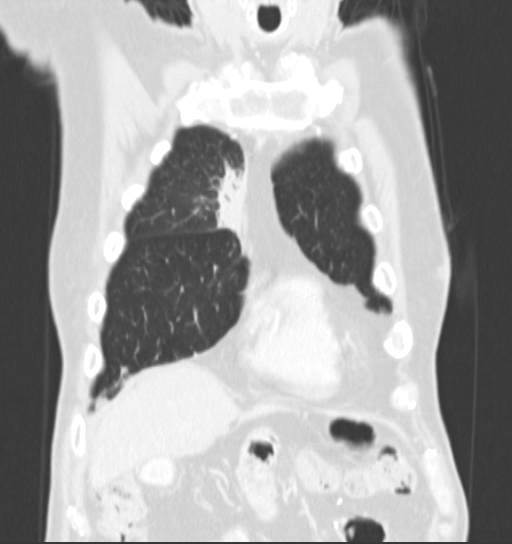
[im 52/130  lung]
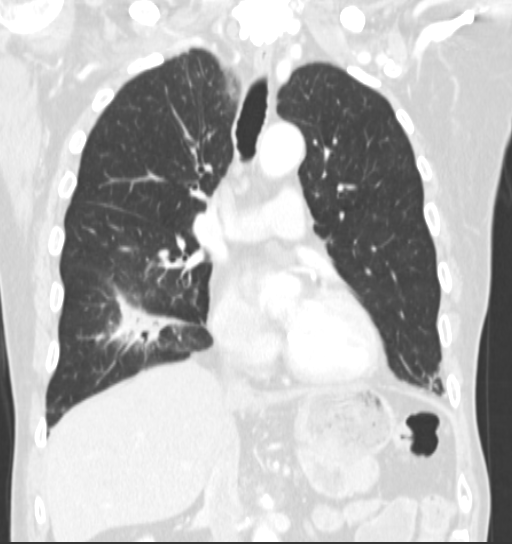
[im 78/130  lung]
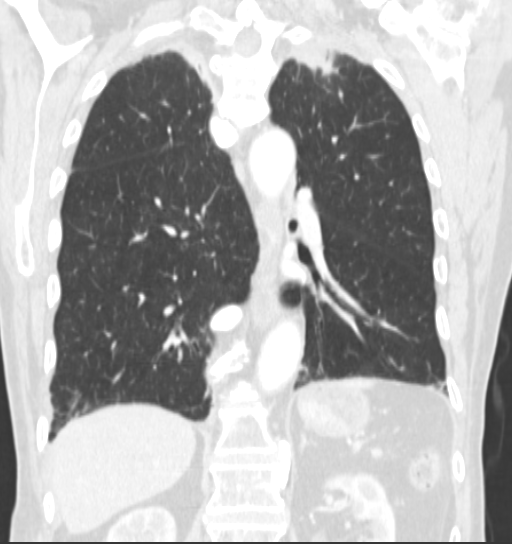

[15 of 34 positions shown; findings below may reference images not displayed]

FINDINGS: Within the anterior aspect of the lower portion of the right middle
lobe, wedge-shaped consolidation with air bronchograms with tenting
of the fissure towards the consolidation.

Within the medial aspect of the anterior inferior portion of the
right upper lobe, wedge-shaped consolidation with air bronchograms.

These areas are of indeterminate etiology. It is possible they
reflect result of atelectasis or scarring. Infectious infiltrate
could not be excluded in the proper clinical setting. Malignancy
cannot be excluded. Based on patient's clinical status, one could
follow these findings when unenhanced chest CT in 3 months. If
further delineation is clinically desired at the present time then
bronchoscopy may be considered.

Minimal nodularity anterior inferior aspect right middle lobe.

Minimal nodularity anterior aspect left upper lobe.

Biapical lung parenchymal changes greater on the left without
associated bony destruction.

These latter findings may represent regions of scarring. Attention
to these on follow-up.

Largest lymph node pretracheal region which short axis dimension 9
mm.

Atherosclerotic type changes thoracic aorta with mild ectasia.

Coronary artery calcifications.  Heart size within normal limits.

Post gastric surgery. Small amount of fluid in the esophagus may be
related to reflux.

Thoracic kyphosis and degenerative changes without bony destructive
lesion.
IMPRESSION: Lung parenchymal changes most notable right lower lobe and right
upper lobe as detailed above. These wedge-shaped regions of
consolidation may represent atelectasis/scarring with air
bronchograms. Infectious infiltrate or malignancy not entirely
excluded. Follow-up as detailed above.

Atherosclerotic type changes thoracic aorta and coronary arteries.

## 2015-04-26 ENCOUNTER — Ambulatory Visit: Payer: Medicare Other

## 2015-05-03 ENCOUNTER — Ambulatory Visit (INDEPENDENT_AMBULATORY_CARE_PROVIDER_SITE_OTHER): Payer: Medicare Other | Admitting: Podiatry

## 2015-05-03 DIAGNOSIS — B351 Tinea unguium: Secondary | ICD-10-CM | POA: Diagnosis not present

## 2015-05-03 DIAGNOSIS — M79676 Pain in unspecified toe(s): Secondary | ICD-10-CM | POA: Diagnosis not present

## 2015-05-03 NOTE — Progress Notes (Signed)
Subjective: 81 y.o. male returns the office today for painful, elongated, thickened toenails which he cannot trim himself. Denies any redness or drainage around the nails. Nails are painful with shoes and socks. Denies any acute changes since last appointment and no new complaints today. Denies any systemic complaints such as fevers, chills, nausea, vomiting.   Objective: AAO 3, NAD DP/PT pulses palpable, CRT less than 3 seconds Nails hypertrophic, dystrophic, elongated, brittle, discolored 10. There is tenderness overlying the nails 1-5 bilaterally. There is no surrounding erythema or drainage along the nail sites. No open lesions or pre-ulcerative lesions are identified. No other areas of tenderness bilateral lower extremities. No overlying edema, erythema, increased warmth. No pain with calf compression, swelling, warmth, erythema.  Assessment: Patient presents with symptomatic onychomycosis  Plan: -Treatment options including alternatives, risks, complications were discussed -Nails sharply debrided 10 without complication/bleeding. -Discussed daily foot inspection. If there are any changes, to call the office immediately.  -Follow-up in 3 months or sooner if any problems are to arise. In the meantime, encouraged to call the office with any questions, concerns, changes symptoms.   Celesta Gentile, DPM

## 2015-07-25 ENCOUNTER — Encounter: Payer: Self-pay | Admitting: Internal Medicine

## 2015-07-25 ENCOUNTER — Ambulatory Visit (INDEPENDENT_AMBULATORY_CARE_PROVIDER_SITE_OTHER)
Admission: RE | Admit: 2015-07-25 | Discharge: 2015-07-25 | Disposition: A | Discharge status: Home / Self Care | Payer: Medicare Other | Source: Ambulatory Visit | Attending: Internal Medicine | Admitting: Internal Medicine

## 2015-07-25 ENCOUNTER — Ambulatory Visit (INDEPENDENT_AMBULATORY_CARE_PROVIDER_SITE_OTHER): Payer: Medicare Other | Admitting: Internal Medicine

## 2015-07-25 VITALS — BP 130/60 | HR 64 | Temp 97.9°F | Wt 180.0 lb

## 2015-07-25 DIAGNOSIS — R059 Cough, unspecified: Secondary | ICD-10-CM | POA: Insufficient documentation

## 2015-07-25 DIAGNOSIS — Z79899 Other long term (current) drug therapy: Secondary | ICD-10-CM

## 2015-07-25 DIAGNOSIS — G3184 Mild cognitive impairment, so stated: Secondary | ICD-10-CM | POA: Diagnosis not present

## 2015-07-25 DIAGNOSIS — G620 Drug-induced polyneuropathy: Secondary | ICD-10-CM

## 2015-07-25 DIAGNOSIS — R05 Cough: Secondary | ICD-10-CM

## 2015-07-25 MED ORDER — AMOXICILLIN-POT CLAVULANATE 875-125 MG PO TABS: 1.0000 | ORAL_TABLET | Freq: Two times a day (BID) | ORAL | Status: DC

## 2015-07-25 NOTE — Assessment & Plan Note (Addendum)
Has symptoms of mild infection---but past history of quickly going into pneumonia Mild fatigue--but no other systemic symptoms Persistent abnormal CXR--doesn't really look different to me Will give Rx for augmentin for if he worsens

## 2015-07-25 NOTE — Progress Notes (Signed)
Pre visit review using our clinic review tool, if applicable. No additional management support is needed unless otherwise documented below in the visit note. 

## 2015-07-25 NOTE — Assessment & Plan Note (Addendum)
Seems to have progressed No apraxia at this point that wife can tell He is not physically active--I recommended that Still has some intellectual stimulation and social interaction Will make referral to Dr Melrose Nakayama

## 2015-07-25 NOTE — Assessment & Plan Note (Signed)
Continues to need contact support (cane or holding on) for stability

## 2015-07-25 NOTE — Patient Instructions (Addendum)
You can try over the counter dextromethorphan for the cough.  Please start the antibiotic if you are worsening over the next few days.

## 2015-07-25 NOTE — Progress Notes (Signed)
Subjective:    Patient ID: Corey Clark, male    DOB: 10/04/1925, 81 y.o.   MRN: [id number]  HPI Here due to cough  Has chronic runny nose that has persisted Some throat clearing Now coughing for 3-4 days Some decreased energy  No fever No night sweats or chills Cough is dry  No sore throat No ear pain  No meds for this  Wife notes more forgetfulness Wonders about seeing neurologist-- Dr Melrose Nakayama for further evaluation Ongoing gait problems due to neuropathy--does okay with cane or holding onto wife Has not had to give up any tasks Has trouble finding words at times-- like to tell wife what he just saw on TV   Current Outpatient Prescriptions on File Prior to Visit  Medication Sig Dispense Refill  . donepezil (ARICEPT) 10 MG tablet TAKE 1 TABLET AT BEDTIME AS NEEDED 90 tablet 3  . Multiple Vitamin (MULTIVITAMIN) tablet Take 1 tablet by mouth daily.    . sucralfate (CARAFATE) 1 GM/10ML suspension Take 10 mLs (1 g total) by mouth 2 (two) times daily. 1680 mL 3  . triamcinolone cream (KENALOG) 0.1 %      No current facility-administered medications on file prior to visit.    Allergies  Allergen Reactions  . Tetracyclines & Related Other (See Comments)    Patient not sure it's been years since he's taken med    Past Medical History  Diagnosis Date  . GERD (gastroesophageal reflux disease)   . Colon polyps   . Osteoarthrosis involving, or with mention of more than one site, but not specified as generalized, multiple sites   . 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 Stomach cancer ([place]) 1999    MALT lymphoma --surgery then chemo. Bad infection after with 1 month hospitalization  . BPH (benign prostatic hypertrophy)   . 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 Partial gastrectomy  1999    2 procedures for stomach cancer  . Rotator cuff repair  1991/1998     left and right done  . Hemorrhoid surgery  1998 & 2000  . Cataract extraction, bilateral  2002, 2003  . Tendon release Left 6/15    Dr Jefm Bryant    Family History  Problem Relation Age of Onset  . Cancer Brother   . Heart disease Neg Hx   . Hypertension Neg Hx   . Diabetes Neg Hx     Social History   Social History  . Marital Status: Married    Spouse Name: N/A  . Number of Children: 3  . Years of Education: N/A   Occupational History  . 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 Smoking status: Former Smoker    Quit date: 10/15/1950  . Smokeless tobacco: Never Used  . Alcohol Use: Yes     Comment: rare wine  . Drug Use: No  . Sexual Activity: Not on file   Other Topics Concern  . Not on file   Social History Narrative   3 children in [place]      Has living will    Wife, then son, Fedrick Cefalu., to be health care POA   Would accept resuscitation attempts   Not sure about tube feeds-- probably doesn't want if cognitively unaware   Review of Systems  Appetite remains good No N/V Saw Dr Vaught--recommended he start omeprazole     Objective:   Physical Exam  Constitutional: He appears well-developed and well-nourished. No distress.  HENT:  No sinus tenderness Mild pale nasal congestion  Neck: Normal range of motion. No thyromegaly present.  Pulmonary/Chest: Effort normal and breath sounds normal. No respiratory distress. He has no wheezes. He has no rales.  Lymphadenopathy:    He has no cervical adenopathy.  Neurological:  Slightly shuffling gait but not true ataxia          Assessment & Plan:

## 2015-07-26 LAB — VITAMIN B12: Vitamin B-12: 399 pg/mL (ref 211–911)

## 2015-08-05 ENCOUNTER — Encounter: Payer: Self-pay | Admitting: Sports Medicine

## 2015-08-05 ENCOUNTER — Ambulatory Visit (INDEPENDENT_AMBULATORY_CARE_PROVIDER_SITE_OTHER): Payer: Medicare Other | Admitting: Sports Medicine

## 2015-08-05 DIAGNOSIS — M79676 Pain in unspecified toe(s): Secondary | ICD-10-CM

## 2015-08-05 DIAGNOSIS — G620 Drug-induced polyneuropathy: Secondary | ICD-10-CM

## 2015-08-05 DIAGNOSIS — B351 Tinea unguium: Secondary | ICD-10-CM

## 2015-08-05 NOTE — Progress Notes (Signed)
Patient ID: Corey Clark, male   DOB: 04/02/1925, 81 y.o.   MRN: [id number] Subjective: Corey Clark is a 81 y.o. male patient seen today in office with complaint of thickened and elongated toenails; unable to trim. Patient denies history of Diabetes or Vascular disease. Admits to Neuropathy secondary to medication. Patient has no other pedal complaints at this time.   Patient Active Problem List   Diagnosis Date Noted  . Cough 07/25/2015  . Advance directive discussed with patient 01/17/2015  . Non-allergic rhinitis 06/29/2014  . Abnormality of lung on CXR 06/29/2014  . [facility] DJD(carpometacarpal degenerative joint disease), localized primary 07/13/2013  . BPH (benign prostatic hypertrophy)   . Mild cognitive impairment 06/30/2012  . Routine general medical examination at a health care facility 05/30/2012  . Other constipation 11/26/2011  . GERD (gastroesophageal reflux disease)   . Osteoarthritis, multiple sites   . Neuropathy due to drug ([place])   . Stomach cancer [facility])    Current Outpatient Prescriptions on File Prior to Visit  Medication Sig Dispense Refill  . amoxicillin-clavulanate (AUGMENTIN) 875-125 MG tablet Take 1 tablet by mouth 2 (two) times daily. 20 tablet 0  . donepezil (ARICEPT) 10 MG tablet TAKE 1 TABLET AT BEDTIME AS NEEDED 90 tablet 3  . Multiple Vitamin (MULTIVITAMIN) tablet Take 1 tablet by mouth daily.    Marland Kitchen omeprazole (PRILOSEC OTC) 20 MG tablet Take 20 mg by mouth daily.    . sucralfate (CARAFATE) 1 GM/10ML suspension Take 10 mLs (1 g total) by mouth 2 (two) times daily. 1680 mL 3  . triamcinolone cream (KENALOG) 0.1 %      No current facility-administered medications on file prior to visit.   Allergies  Allergen Reactions  . Tetracyclines & Related Other (See Comments)    Patient not sure it's been years since he's taken med     Objective: Physical Exam  General: Well developed, nourished, no acute distress, awake, alert and oriented x  3  Vascular: Dorsalis pedis artery 1/4 bilateral, Posterior tibial artery 1/4 bilateral, skin temperature warm to warm proximal to distal bilateral lower extremities, + varicosities, Scant pedal hair present bilateral.  Neurological: Epicritic sensation slightly diminised via 5.07 Semmes Weinstein at all pedal sites, vibratory sensation diminished bilateral  Dermatological: Skin is warm, dry, and supple bilateral, Nails 1-10 are tender, long, thick, and discolored with mild subungal debris, no webspace macerations present bilateral, no open lesions present bilateral, no callus/corns/hyperkeratotic tissue present bilateral. No signs of infection bilateral.  Musculoskeletal: No boney deformities noted bilateral. Muscular strength within normal limits without pain or limitation on range of motion. No pain with calf compression bilateral.  Assessment and Plan:  Problem List Items Addressed This Visit      Nervous and Auditory   Neuropathy due to drug [facility])    Other Visit Diagnoses    Dermatophytosis of nail    -  Primary    Pain of toe, unspecified laterality          -Examined patient -Discussed treatment options for painful mycotic nails. -Mechanically debrided and reduced mycotic nails with sterile nail nipper and dremel nail file without incident. -Recommend good supportive shoes daily. -Patient to return in 3 months for follow up evaluation or sooner if symptoms worsen.  Landis Martins, DPM

## 2015-08-08 ENCOUNTER — Ambulatory Visit: Payer: Medicare Other

## 2015-08-17 ENCOUNTER — Encounter: Payer: Self-pay | Admitting: Internal Medicine

## 2015-08-18 NOTE — Telephone Encounter (Signed)
Please log in the flu shot

## 2015-08-31 DIAGNOSIS — G301 Alzheimer's disease with late onset: Secondary | ICD-10-CM

## 2015-08-31 DIAGNOSIS — F0280 Dementia in other diseases classified elsewhere without behavioral disturbance: Secondary | ICD-10-CM | POA: Insufficient documentation

## 2015-08-31 DIAGNOSIS — R278 Other lack of coordination: Secondary | ICD-10-CM | POA: Insufficient documentation

## 2015-10-06 ENCOUNTER — Other Ambulatory Visit: Payer: Self-pay | Admitting: Internal Medicine

## 2015-11-04 ENCOUNTER — Ambulatory Visit: Payer: Medicare Other | Admitting: Sports Medicine

## 2015-11-08 ENCOUNTER — Ambulatory Visit (INDEPENDENT_AMBULATORY_CARE_PROVIDER_SITE_OTHER): Payer: Medicare Other | Admitting: Sports Medicine

## 2015-11-08 ENCOUNTER — Encounter: Payer: Self-pay | Admitting: Sports Medicine

## 2015-11-08 DIAGNOSIS — M79676 Pain in unspecified toe(s): Secondary | ICD-10-CM

## 2015-11-08 DIAGNOSIS — B351 Tinea unguium: Secondary | ICD-10-CM

## 2015-11-08 DIAGNOSIS — G620 Drug-induced polyneuropathy: Secondary | ICD-10-CM

## 2015-11-08 NOTE — Progress Notes (Signed)
Patient ID: Corey Clark, male   DOB: 21-Sep-1925, 82 y.o.   MRN: [id number]  Subjective: Corey Clark is a 82 y.o. male patient seen today in office with complaint of thickened and elongated toenails; unable to trim. Patient denies history of Diabetes or Vascular disease. 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 Patient has no other pedal complaints at this time.   Patient Active Problem List   Diagnosis Date Noted  . Cough 07/25/2015  . Advance directive discussed with patient 01/17/2015  . Non-allergic rhinitis 06/29/2014  . Abnormality of lung on CXR 06/29/2014  . [facility] DJD(carpometacarpal degenerative joint disease), localized primary 07/13/2013  . BPH (benign prostatic hypertrophy)   . Mild cognitive impairment 06/30/2012  . Routine general medical examination at a health care facility 05/30/2012  . Other constipation 11/26/2011  . GERD (gastroesophageal reflux disease)   . Osteoarthritis, multiple sites   . Neuropathy due to drug ([place])   . Stomach cancer [facility])    Current Outpatient Prescriptions on File Prior to Visit  Medication Sig Dispense Refill  . amoxicillin-clavulanate (AUGMENTIN) 875-125 MG tablet Take 1 tablet by mouth 2 (two) times daily. 20 tablet 0  . CARAFATE 1 GM/10ML suspension TAKE 10 MLS (1 GRAM TOTAL) TWO TIMES DAILY 1680 mL 2  . donepezil (ARICEPT) 10 MG tablet TAKE 1 TABLET AT BEDTIME AS NEEDED 90 tablet 3  . Multiple Vitamin (MULTIVITAMIN) tablet Take 1 tablet by mouth daily.    Marland Kitchen omeprazole (PRILOSEC OTC) 20 MG tablet Take 20 mg by mouth daily.    Marland Kitchen triamcinolone cream (KENALOG) 0.1 %      No current facility-administered medications on file prior to visit.   Allergies  Allergen Reactions  . Tetracyclines & Related Other (See Comments)    Patient not sure it's been years since he's taken med     Objective: Physical Exam  General: Well developed, nourished, no acute distress, awake, alert and oriented x 3  Vascular: Dorsalis pedis  artery 1/4 bilateral, Posterior tibial artery 1/4 bilateral, skin temperature warm to warm proximal to distal bilateral lower extremities, + varicosities, Scant pedal hair present bilateral.  Neurological: Epicritic sensation slightly diminised via 5.07 Semmes Weinstein at all pedal sites, vibratory sensation diminished bilateral  Dermatological: Skin is warm, dry, and supple bilateral, Nails 1-10 are tender, long, thick, and discolored with mild subungal debris, no webspace macerations present bilateral, no open lesions present bilateral, no callus/corns/hyperkeratotic tissue present bilateral. No signs of infection bilateral.  Musculoskeletal: No boney deformities noted bilateral. Muscular strength within normal limits without pain or limitation on range of motion. No pain with calf compression bilateral.  Assessment and Plan:  Problem List Items Addressed This Visit      Nervous and Auditory   Neuropathy due to drug [facility])    Other Visit Diagnoses    Dermatophytosis of nail    -  Primary    Pain of toe, unspecified laterality          -Examined patient -Discussed treatment options for painful mycotic nails. -Mechanically debrided and reduced mycotic nails with sterile nail nipper and dremel nail file without incident. -Recommend good supportive shoes daily. -Patient to return in 3 months for follow up evaluation or sooner if symptoms worsen.  Corey Clark, DPM

## 2016-01-19 ENCOUNTER — Ambulatory Visit (INDEPENDENT_AMBULATORY_CARE_PROVIDER_SITE_OTHER): Payer: Medicare Other

## 2016-01-19 VITALS — BP 102/62 | HR 71 | Temp 97.3°F | Ht 70.0 in | Wt 177.5 lb

## 2016-01-19 DIAGNOSIS — Z0000 Encounter for general adult medical examination without abnormal findings: Secondary | ICD-10-CM | POA: Diagnosis not present

## 2016-01-19 NOTE — Progress Notes (Signed)
   Subjective:    Patient ID: Corey Clark, male    DOB: Apr 10, 1925, 82 y.o.   MRN: [id number]  HPI I reviewed health advisor's note, was available for consultation, and agree with documentation and plan.    Review of Systems     Objective:   Physical Exam        Assessment & Plan:

## 2016-01-19 NOTE — Progress Notes (Signed)
Pre visit review using our clinic review tool, if applicable. No additional management support is needed unless otherwise documented below in the visit note. 

## 2016-01-19 NOTE — Progress Notes (Signed)
Subjective:   Corey Clark is a 82 y.o. male who presents for Medicare Annual/Subsequent preventive examination.  Cardiac Risk Factors include: advanced age (>59men, >67 women);male gender     Objective:    Vitals: BP 102/62 mmHg  Pulse 71  Temp(Src) 97.3 F (36.3 C) (Oral)  Ht 5\' 10"  (1.778 m)  Wt 177 lb 8 oz (80.513 kg)  BMI 25.47 kg/m2  SpO2 99%  Body mass index is 25.47 kg/(m^2).  Tobacco History  Smoking status  . Former Smoker  . Quit date: 10/15/1950  Smokeless tobacco  . Never Used     Counseling given: No   Past Medical History  Diagnosis Date  . GERD (gastroesophageal reflux disease)   . Colon polyps   . Osteoarthrosis involving, or with mention of more than one site, but not specified as generalized, multiple sites   . Neuropathy due to drug [facility])     chemo for stomach cancer  . Stomach cancer ([place]) 1999    MALT lymphoma --surgery then chemo. Bad infection after with 1 month hospitalization  . BPH (benign prostatic hypertrophy)   . SCC (squamous cell carcinoma), face 2014    left preauricular---[facility]   Past Surgical History  Procedure Laterality Date  . Partial gastrectomy  1999    2 procedures for stomach cancer  . Rotator cuff repair  1991/1998    left and right done  . Hemorrhoid surgery  1998 & 2000  . Cataract extraction, bilateral  2002, 2003  . Tendon release Left 6/15    Dr Jefm Bryant   Family History  Problem Relation Age of Onset  . Cancer Brother   . Heart disease Neg Hx   . Hypertension Neg Hx   . Diabetes Neg Hx    History  Sexual Activity  . Sexual Activity: No    Outpatient Encounter Prescriptions as of 01/19/2016  Medication Sig  . CARAFATE 1 GM/10ML suspension TAKE 10 MLS (1 GRAM TOTAL) TWO TIMES DAILY  . donepezil (ARICEPT) 10 MG tablet TAKE 1 TABLET AT BEDTIME AS NEEDED  . Multiple Vitamin (MULTIVITAMIN) tablet Take 1 tablet by mouth daily.  Marland Kitchen omeprazole (PRILOSEC OTC) 20 MG tablet Take 20 mg by mouth daily.  Marland Kitchen  triamcinolone cream (KENALOG) 0.1 %   . [DISCONTINUED] amoxicillin-clavulanate (AUGMENTIN) 875-125 MG tablet Take 1 tablet by mouth 2 (two) times daily.   No facility-administered encounter medications on file as of 01/19/2016.    Activities of Daily Living In your present state of health, do you have any difficulty performing the following activities: 01/19/2016  Hearing? Y  Vision? N  Difficulty concentrating or making decisions? Y  Walking or climbing stairs? N  Dressing or bathing? N  Doing errands, shopping? Y  Preparing Food and eating ? N  Using the Toilet? N  In the past six months, have you accidently leaked urine? N  Do you have problems with loss of bowel control? N  Managing your Medications? N  Managing your Finances? N  Housekeeping or managing your Housekeeping? N    Patient Care Team: Venia Carbon, MD as PCP - General (Pediatrics) Estill Cotta, MD as Consulting Physician (Ophthalmology) Kennieth Francois, MD as Consulting Physician (Dermatology) Wallene Huh, DPM as Consulting Physician (Podiatry) Carloyn Manner, MD as Referring Physician (Otolaryngology) Anabel Bene, MD as Referring Physician (Neurology) Kipp Laurence, MD as Referring Physician (Audiology) Hyman Hopes, DDS as Consulting Physician (Dentistry)   Assessment:     Exercise Activities and  Dietary recommendations Current Exercise Habits: The patient does not participate in regular exercise at present, Exercise limited by: None identified  Goals    . Increase physical activity     Starting 01/19/2016, I will begin doing chair exercises for at least 15 min daily.       Fall Risk Fall Risk  01/19/2016 01/17/2015 01/17/2015 01/04/2014 09/08/2012  Falls in the past year? No No No Yes Yes  Number falls in past yr: - - - 1 1  Injury with Fall? - - - Yes No  Risk Factor Category  - - - High Fall Risk -  Risk for fall due to : - Impaired balance/gait - History of fall(s) -   Depression  Screen PHQ 2/9 Scores 01/19/2016 01/17/2015 01/17/2015 01/04/2014  PHQ - 2 Score 0 1 0 0    Cognitive Testing MMSE - Mini Mental State Exam 01/19/2016 01/19/2016  Orientation to time 5 5  Orientation to Place 5 5  Registration 3 3  Attention/ Calculation 0 0  Recall 1 1  Language- name 2 objects 0 0  Language- repeat 1 1  Language- follow 3 step command 3 3  Language- read & follow direction 0 0  Write a sentence 0 0  Copy design 0 0  Total score 18 18   PLEASE NOTE: A Mini-Cog screen was completed. Maximum score is 20. A value of 0 denotes this part of Folstein MMSE was not completed.  Orientation to Time - Max 5 Orientation to Place - Max 5 Registration - Max 3 Recall - Max 3 Language Repeat - Max 1 Language Follow 3 Step Command - Max 3   Immunization History  Administered Date(s) Administered  . Influenza Split 08/15/2011  . Influenza,inj,Quad PF,36+ Mos 07/07/2013, 06/29/2014, 08/11/2015  . Influenza-Unspecified 08/12/2015  . Pneumococcal Conjugate-13 01/04/2014  . Pneumococcal Polysaccharide-23 10/15/2010  . Tdap 10/15/2010  . Zoster 10/16/2007   Screening Tests Health Maintenance  Topic Date Due  . INFLUENZA VACCINE  05/15/2016  . TETANUS/TDAP  10/15/2020  . ZOSTAVAX  Completed  . PNA vac Low Risk Adult  Completed      Plan:     I have personally reviewed and addressed the Medicare Annual Wellness questionnaire and have noted the following in the patient's chart:  A. Medical and social history B. Use of alcohol, tobacco or illicit drugs  C. Current medications and supplements D. Functional ability and status E.  Nutritional status F.  Physical activity G. Advance directives H. List of other physicians I.  Hospitalizations, surgeries, and ER visits in previous 12 months J.  [place] to include hearing, vision, cognitive, depression L. Referrals and appointments - none  In addition, I have reviewed and discussed with patient certain preventive  protocols, quality metrics, and best practice recommendations. A written personalized care plan for preventive services as well as general preventive health recommendations were provided to patient.  See attached scanned questionnaire for additional information.   Signed,   Lindell Noe, MHA, BS, LPN Health Advisor [id number]

## 2016-01-19 NOTE — Patient Instructions (Signed)
Corey Clark , Thank you for taking time to come for your Medicare Wellness Visit. I appreciate your ongoing commitment to your health goals. Please review the following plan we discussed and let me know if I can assist you in the future.   These are the goals we discussed: Goals    . Increase physical activity     Starting 01/19/2016, I will begin doing chair exercises for at least 15 min daily.        This is a list of the screening recommended for you and due dates:  Health Maintenance  Topic Date Due  . Flu Shot  05/15/2016  . Tetanus Vaccine  10/15/2020  . Shingles Vaccine  Completed  . Pneumonia vaccines  Completed   Preventive Care for Adults  A healthy lifestyle and preventive care can promote health and wellness. Preventive health guidelines for adults include the following key practices.  . A routine yearly physical is a good way to check with your health care provider about your health and preventive screening. It is a chance to share any concerns and updates on your health and to receive a thorough exam.  . Visit your dentist for a routine exam and preventive care every 6 months. Brush your teeth twice a day and floss once a day. Good oral hygiene prevents tooth decay and gum disease.  . The frequency of eye exams is based on your age, health, family medical history, use  of contact lenses, and other factors. Follow your health care provider's ecommendations for frequency of eye exams.  . Eat a healthy diet. Foods like vegetables, fruits, whole grains, low-fat dairy products, and lean protein foods contain the nutrients you need without too many calories. Decrease your intake of foods high in solid fats, added sugars, and salt. Eat the right amount of calories for you. Get information about a proper diet from your health care provider, if necessary.  . Regular physical exercise is one of the most important things you can do for your health. Most adults should get at least  150 minutes of moderate-intensity exercise (any activity that increases your heart rate and causes you to sweat) each week. In addition, most adults need muscle-strengthening exercises on 2 or more days a week.  Silver Sneakers may be a benefit available to you. To determine eligibility, you may visit the website: www.silversneakers.com or contact program at 579-018-2629 Mon-Fri between 8AM-8PM.   . Maintain a healthy weight. The body mass index (BMI) is a screening tool to identify possible weight problems. It provides an estimate of body fat based on height and weight. Your health care provider can find your BMI and can help you achieve or maintain a healthy weight.   For adults 20 years and older: ? A BMI below 18.5 is considered underweight. ? A BMI of 18.5 to 24.9 is normal. ? A BMI of 25 to 29.9 is considered overweight. ? A BMI of 30 and above is considered obese.   . Maintain normal blood lipids and cholesterol levels by exercising and minimizing your intake of saturated fat. Eat a balanced diet with plenty of fruit and vegetables. Blood tests for lipids and cholesterol should begin at age 19 and be repeated every 5 years. If your lipid or cholesterol levels are high, you are over 50, or you are at high risk for heart disease, you may need your cholesterol levels checked more frequently. Ongoing high lipid and cholesterol levels should be treated with medicines if diet  and exercise are not working.  . If you smoke, find out from your health care provider how to quit. If you do not use tobacco, please do not start.  . If you choose to drink alcohol, please do not consume more than 2 drinks per day. One drink is considered to be 12 ounces (355 mL) of beer, 5 ounces (148 mL) of wine, or 1.5 ounces (44 mL) of liquor.  . If you are 33-15 years old, ask your health care provider if you should take aspirin to prevent strokes.  . Use sunscreen. Apply sunscreen liberally and repeatedly  throughout the day. You should seek shade when your shadow is shorter than you. Protect yourself by wearing long sleeves, pants, a wide-brimmed hat, and sunglasses year round, whenever you are outdoors.  . Once a month, do a whole body skin exam, using a mirror to look at the skin on your back. Tell your health care provider of new moles, moles that have irregular borders, moles that are larger than a pencil eraser, or moles that have changed in shape or color.

## 2016-02-07 ENCOUNTER — Encounter: Payer: Self-pay | Admitting: Sports Medicine

## 2016-02-07 ENCOUNTER — Ambulatory Visit (INDEPENDENT_AMBULATORY_CARE_PROVIDER_SITE_OTHER): Payer: Medicare Other | Admitting: Sports Medicine

## 2016-02-07 DIAGNOSIS — M79676 Pain in unspecified toe(s): Secondary | ICD-10-CM | POA: Diagnosis not present

## 2016-02-07 DIAGNOSIS — J189 Pneumonia, unspecified organism: Secondary | ICD-10-CM | POA: Insufficient documentation

## 2016-02-07 DIAGNOSIS — G620 Drug-induced polyneuropathy: Secondary | ICD-10-CM | POA: Diagnosis not present

## 2016-02-07 DIAGNOSIS — B351 Tinea unguium: Secondary | ICD-10-CM | POA: Diagnosis not present

## 2016-02-07 DIAGNOSIS — J181 Lobar pneumonia, unspecified organism: Secondary | ICD-10-CM

## 2016-02-07 NOTE — Progress Notes (Signed)
Patient ID: BAHE SANKEY, male   DOB: 21-Dec-1924, 82 y.o.   MRN: [id number]  Subjective: Corey Clark is a 82 y.o. male patient seen today in office with complaint of thickened and elongated toenails; unable to trim. Patient denies history of Diabetes or Vascular disease. 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 Patient has no other pedal complaints at this time.   Patient Active Problem List   Diagnosis Date Noted  . Right middle lobe pneumonia 02/07/2016  . Mild dementia 08/31/2015  . 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 Cough 07/25/2015  . Advance directive discussed with patient 01/17/2015  . Non-allergic rhinitis 06/29/2014  . Abnormality of lung on CXR 06/29/2014  . History of surgical procedure 05/10/2014  . Trigger finger 04/07/2014  . [facility] DJD(carpometacarpal degenerative joint disease), localized primary 07/13/2013  . H/O malignant neoplasm of skin 06/29/2013  . BPH (benign prostatic hypertrophy)   . Mild cognitive impairment 06/30/2012  . Routine general medical examination at a health care facility 05/30/2012  . Other constipation 11/26/2011  . GERD (gastroesophageal reflux disease)   . Osteoarthritis, multiple sites   . Neuropathy due to drug ([place])   . Stomach cancer [facility])    Current Outpatient Prescriptions on File Prior to Visit  Medication Sig Dispense Refill  . CARAFATE 1 GM/10ML suspension TAKE 10 MLS (1 GRAM TOTAL) TWO TIMES DAILY 1680 mL 2  . donepezil (ARICEPT) 10 MG tablet TAKE 1 TABLET AT BEDTIME AS NEEDED 90 tablet 3  . Multiple Vitamin (MULTIVITAMIN) tablet Take 1 tablet by mouth daily.    Marland Kitchen omeprazole (PRILOSEC OTC) 20 MG tablet Take 20 mg by mouth daily.    Marland Kitchen triamcinolone cream (KENALOG) 0.1 %      No current facility-administered medications on file prior to visit.   Allergies  Allergen Reactions  . Tetracyclines & Related Other (See Comments)    Patient not sure it's been years since he's taken med     Objective: Physical  Exam  General: Well developed, nourished, no acute distress, awake, alert and oriented x 3  Vascular: Dorsalis pedis artery 1/4 bilateral, Posterior tibial artery 1/4 bilateral, skin temperature warm to warm proximal to distal bilateral lower extremities, + varicosities, Scant pedal hair present bilateral.  Neurological: Epicritic sensation slightly diminised via 5.07 Semmes Weinstein at all pedal sites, vibratory sensation diminished bilateral  Dermatological: Skin is warm, dry, and supple bilateral, Nails 1-10 are tender, long, thick, and discolored with mild subungal debris, no webspace macerations present bilateral, no open lesions present bilateral, no callus/corns/hyperkeratotic tissue present bilateral. No signs of infection bilateral.  Musculoskeletal: No boney deformities noted bilateral. Muscular strength within normal limits without pain or limitation on range of motion. No pain with calf compression bilateral.  Assessment and Plan:  Problem List Items Addressed This Visit      Nervous and Auditory   Neuropathy due to drug [facility])    Other Visit Diagnoses    Dermatophytosis of nail    -  Primary    Pain of toe, unspecified laterality          -Examined patient -Discussed treatment options for painful mycotic nails. -Mechanically debrided and reduced mycotic nails with sterile nail nipper and dremel nail file without incident. -Recommend good supportive shoes daily. -Patient to follow up appeal for medicare and inform me of if they need more documentation sent on behalf -Patient to return in 3 months for follow up evaluation or sooner if symptoms worsen.  Landis Martins, DPM

## 2016-02-14 ENCOUNTER — Ambulatory Visit (INDEPENDENT_AMBULATORY_CARE_PROVIDER_SITE_OTHER): Payer: Medicare Other | Admitting: Internal Medicine

## 2016-02-14 ENCOUNTER — Encounter: Payer: Self-pay | Admitting: Internal Medicine

## 2016-02-14 VITALS — BP 106/64 | HR 74 | Temp 97.3°F | Ht 69.0 in | Wt 171.8 lb

## 2016-02-14 DIAGNOSIS — Z0000 Encounter for general adult medical examination without abnormal findings: Secondary | ICD-10-CM

## 2016-02-14 DIAGNOSIS — G301 Alzheimer's disease with late onset: Secondary | ICD-10-CM

## 2016-02-14 DIAGNOSIS — R278 Other lack of coordination: Secondary | ICD-10-CM

## 2016-02-14 DIAGNOSIS — G620 Drug-induced polyneuropathy: Secondary | ICD-10-CM | POA: Diagnosis not present

## 2016-02-14 DIAGNOSIS — K219 Gastro-esophageal reflux disease without esophagitis: Secondary | ICD-10-CM

## 2016-02-14 DIAGNOSIS — F0280 Dementia in other diseases classified elsewhere without behavioral disturbance: Secondary | ICD-10-CM

## 2016-02-14 LAB — COMPREHENSIVE METABOLIC PANEL
ALK PHOS: 79 U/L (ref 39–117)
ALT: 17 U/L (ref 0–53)
AST: 33 U/L (ref 0–37)
Albumin: 3.4 g/dL — ABNORMAL LOW (ref 3.5–5.2)
BUN: 19 mg/dL (ref 6–23)
CO2: 30 mEq/L (ref 19–32)
Calcium: 9.4 mg/dL (ref 8.4–10.5)
Chloride: 102 mEq/L (ref 96–112)
Creatinine, Ser: 1.13 mg/dL (ref 0.40–1.50)
GFR: 64.99 mL/min (ref >60.00)
GLUCOSE: 92 mg/dL (ref 70–99)
POTASSIUM: 4.4 meq/L (ref 3.5–5.1)
Sodium: 138 mEq/L (ref 135–145)
TOTAL PROTEIN: 7.8 g/dL (ref 6.0–8.3)
Total Bilirubin: 0.7 mg/dL (ref 0.2–1.2)

## 2016-02-14 LAB — T4, FREE: Free T4: 0.96 ng/dL (ref 0.60–1.60)

## 2016-02-14 LAB — CBC WITH DIFFERENTIAL/PLATELET
Basophils Absolute: 0 10*3/uL (ref 0.0–0.1)
Basophils Relative: 0.6 % (ref 0.0–3.0)
EOS PCT: 5.9 % — AB (ref 0.0–5.0)
Eosinophils Absolute: 0.3 10*3/uL (ref 0.0–0.7)
HCT: 44.3 % (ref 39.0–52.0)
HEMOGLOBIN: 14.9 g/dL (ref 13.0–17.0)
LYMPHS ABS: 1.6 10*3/uL (ref 0.7–4.0)
Lymphocytes Relative: 28.7 % (ref 12.0–46.0)
MCHC: 33.8 g/dL (ref 30.0–36.0)
MCV: 89.9 fl (ref 78.0–100.0)
MONOS PCT: 14.8 % — AB (ref 3.0–12.0)
Monocytes Absolute: 0.8 10*3/uL (ref 0.1–1.0)
NEUTROS PCT: 50 % (ref 43.0–77.0)
Neutro Abs: 2.7 10*3/uL (ref 1.4–7.7)
Platelets: 180 10*3/uL (ref 150.0–400.0)
RBC: 4.93 Mil/uL (ref 4.22–5.81)
RDW: 14.1 % (ref 11.5–15.5)
WBC: 5.4 10*3/uL (ref 4.0–10.5)

## 2016-02-14 NOTE — Progress Notes (Signed)
Pre visit review using our clinic review tool, if applicable. No additional management support is needed unless otherwise documented below in the visit note. 

## 2016-02-14 NOTE — Assessment & Plan Note (Signed)
Needs to continue his PPI

## 2016-02-14 NOTE — Progress Notes (Signed)
Subjective:    Patient ID: URI ARCHILLA, male    DOB: 12/03/1924, 82 y.o.   MRN: [id number]  HPI Here for physical and follow up of chronic health conditions With wife  ongoing memory problems Wife notes only mild worsening Changes in routine--"throw him for a loop" Recent party---he forgot details (when, who was coming, where they were staying, etc) Wife does the driving--stopped around Christmas per wife's decision Still does the cooking--but has given up many hobbies (states they "don't have the room"---stained glass, Human resources officer) Continues on donepezil  Wife notes a decrease in appetite Some weight loss--but not striking till comparing to last visit Wife is working to gain weight---she is helping him a little more  Still on PPI Feels this is keeping acid symptoms controlled No swallowing problems but does a lot of throat clearing  Current Outpatient Prescriptions on File Prior to Visit  Medication Sig Dispense Refill  . CARAFATE 1 GM/10ML suspension TAKE 10 MLS (1 GRAM TOTAL) TWO TIMES DAILY 1680 mL 2  . donepezil (ARICEPT) 10 MG tablet TAKE 1 TABLET AT BEDTIME AS NEEDED 90 tablet 3  . Multiple Vitamin (MULTIVITAMIN) tablet Take 1 tablet by mouth daily.    Marland Kitchen omeprazole (PRILOSEC OTC) 20 MG tablet Take 20 mg by mouth daily.    Marland Kitchen triamcinolone cream (KENALOG) 0.1 %      No current facility-administered medications on file prior to visit.    Allergies  Allergen Reactions  . Tetracyclines & Related Other (See Comments)    Patient not sure it's been years since he's taken med    Past Medical History  Diagnosis Date  . GERD (gastroesophageal reflux disease)   . Colon polyps   . Osteoarthrosis involving, or with mention of more than one site, but not specified as generalized, multiple sites   . Neuropathy due to drug [facility])     chemo for stomach cancer  . Stomach cancer ([place]) 1999    MALT lymphoma --surgery then chemo. Bad infection after with 1 month  hospitalization  . BPH (benign prostatic hypertrophy)   . 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 Partial gastrectomy  1999    2 procedures for stomach cancer  . Rotator cuff repair  1991/1998    left and right done  . Hemorrhoid surgery  1998 & 2000  . Cataract extraction, bilateral  2002, 2003  . Tendon release Left 6/15    Dr Jefm Bryant    Family History  Problem Relation Age of Onset  . Cancer Brother   . Heart disease Neg Hx   . Hypertension Neg Hx   . Diabetes Neg Hx     Social History   Social History  . Marital Status: Married    Spouse Name: N/A  . Number of Children: 3  . Years of Education: N/A   Occupational History  . 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 Smoking status: Former Smoker    Quit date: 10/15/1950  . Smokeless tobacco: Never Used  . Alcohol Use: Yes     Comment: rare wine  . Drug Use: No  . Sexual Activity: No   Other Topics Concern  . Not on file   Social History Narrative   3 children in [place]      Has living will    Wife, then son,  Earlean Shawl., to be health care POA   Would accept resuscitation attempts   Not sure about tube feeds-- probably doesn't want if cognitively unaware   Review of Systems  Constitutional: Positive for unexpected weight change. Negative for fatigue.  HENT: Positive for hearing loss. Negative for dental problem.        Hearing aides but still some problems with word discrimination  Eyes: Negative for visual disturbance.       No diplopia or unilateral vision loss  Respiratory: Negative for cough, chest tightness and shortness of breath.   Cardiovascular: Negative for chest pain, palpitations and leg swelling.  Gastrointestinal: Negative for abdominal pain and blood in stool.  Endocrine: Negative for polydipsia and polyuria.  Genitourinary: Positive for frequency and  difficulty urinating.  Musculoskeletal: Positive for arthralgias. Negative for joint swelling.       Trouble walking  Skin: Negative for rash.  Allergic/Immunologic: Positive for environmental allergies. Negative for immunocompromised state.       Using nasal spray from Dr Pryor Ochoa  Neurological: Positive for numbness. Negative for dizziness, syncope, weakness and light-headedness.       Same sensory symptoms  Hematological: Negative for adenopathy. Bruises/bleeds easily.  Psychiatric/Behavioral: Negative for sleep disturbance and dysphoric mood.       Worries about his getting older       Objective:   Physical Exam  Constitutional: He appears well-developed and well-nourished. No distress.  HENT:  Head: Normocephalic and atraumatic.  Right Ear: External ear normal.  Left Ear: External ear normal.  Mouth/Throat: Oropharynx is clear and moist. No oropharyngeal exudate.  Eyes: Conjunctivae are normal. Pupils are equal, round, and reactive to light.  Neck: Normal range of motion. Neck supple. No thyromegaly present.  Cardiovascular: Normal rate, regular rhythm and normal heart sounds.  Exam reveals no gallop.   No murmur heard. Pulmonary/Chest: Effort normal and breath sounds normal. No respiratory distress. He has no wheezes. He has no rales.  Abdominal: Soft. There is no tenderness.  Musculoskeletal: He exhibits no tenderness.  Trace ankle edema  Lymphadenopathy:    He has no cervical adenopathy.  Neurological: He is alert.  2nd day of 7th month, 2017 "medical"  "[place]--no, .[place]"--then "you mean [place]"  Skin: No rash noted. No erythema.  Several recently treated lesions Mycotic toenails  Psychiatric: He has a normal mood and affect. His behavior is normal.          Assessment & Plan:

## 2016-02-14 NOTE — Assessment & Plan Note (Signed)
Mild but now with early functional decline Discussed social stimulation and regular exercise On donepezil Watch weight and eating

## 2016-02-14 NOTE — Assessment & Plan Note (Signed)
From chemo Needs podiatric help with mycotic toenails

## 2016-02-14 NOTE — Assessment & Plan Note (Signed)
Discussed again that a cane will help

## 2016-02-14 NOTE — Assessment & Plan Note (Signed)
No cancer screening due to age UTD on imms---yearly flu shot

## 2016-05-08 ENCOUNTER — Encounter: Payer: Self-pay | Admitting: Sports Medicine

## 2016-05-08 ENCOUNTER — Ambulatory Visit (INDEPENDENT_AMBULATORY_CARE_PROVIDER_SITE_OTHER): Payer: Medicare Other | Admitting: Sports Medicine

## 2016-05-08 DIAGNOSIS — B351 Tinea unguium: Secondary | ICD-10-CM

## 2016-05-08 DIAGNOSIS — G620 Drug-induced polyneuropathy: Secondary | ICD-10-CM

## 2016-05-08 DIAGNOSIS — M79676 Pain in unspecified toe(s): Secondary | ICD-10-CM

## 2016-05-08 NOTE — Progress Notes (Signed)
Patient ID: YHAIR POURCIAU, male   DOB: 1925-01-01, 82 y.o.   MRN: [id number]  Subjective: Corey Clark is a 82 y.o. male patient seen today in office with complaint of thickened and elongated toenails; unable to trim. Patient denies history of Diabetes or Vascular disease. 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 Patient has no other pedal complaints at this time.   Patient Active Problem List   Diagnosis Date Noted  . Alzheimer's dementia, late onset 08/31/2015  . 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 Cough 07/25/2015  . Advance directive discussed with patient 01/17/2015  . Non-allergic rhinitis 06/29/2014  . Abnormality of lung on CXR 06/29/2014  . Trigger finger 04/07/2014  . [facility] DJD(carpometacarpal degenerative joint disease), localized primary 07/13/2013  . H/O malignant neoplasm of skin 06/29/2013  . BPH (benign prostatic hypertrophy)   . Routine general medical examination at a health care facility 05/30/2012  . Other constipation 11/26/2011  . GERD (gastroesophageal reflux disease)   . Osteoarthritis, multiple sites   . Neuropathy due to drug [facility])    Current Outpatient Prescriptions on File Prior to Visit  Medication Sig Dispense Refill  . CARAFATE 1 GM/10ML suspension TAKE 10 MLS (1 GRAM TOTAL) TWO TIMES DAILY 1680 mL 2  . donepezil (ARICEPT) 10 MG tablet TAKE 1 TABLET AT BEDTIME AS NEEDED 90 tablet 3  . Multiple Vitamin (MULTIVITAMIN) tablet Take 1 tablet by mouth daily.    Marland Kitchen omeprazole (PRILOSEC OTC) 20 MG tablet Take 20 mg by mouth daily.    Marland Kitchen triamcinolone cream (KENALOG) 0.1 %      No current facility-administered medications on file prior to visit.    Allergies  Allergen Reactions  . Tetracyclines & Related Other (See Comments)    Patient not sure it's been years since he's taken med     Objective: Physical Exam  General: Well developed, nourished, no acute distress, awake, alert and oriented x 3  Vascular: Dorsalis pedis artery 1/4 bilateral,  Posterior tibial artery 1/4 bilateral, skin temperature warm to warm proximal to distal bilateral lower extremities, + varicosities, Scant pedal hair present bilateral.  Neurological: Epicritic sensation slightly diminised via 5.07 Semmes Weinstein at all pedal sites, vibratory sensation diminished bilateral  Dermatological: Skin is warm, dry, and supple bilateral, Nails 1-10 are tender, long, thick, and discolored with mild subungal debris, no webspace macerations present bilateral, no open lesions present bilateral, no callus/corns/hyperkeratotic tissue present bilateral. No signs of infection bilateral.  Musculoskeletal: No boney deformities noted bilateral. Muscular strength within normal limits without pain or limitation on range of motion. No pain with calf compression bilateral.  Assessment and Plan:  Problem List Items Addressed This Visit      Nervous and Auditory   Neuropathy due to drug [facility])    Other Visit Diagnoses    Dermatophytosis of nail    -  Primary   Pain of toe, unspecified laterality         -Examined patient -Discussed treatment options for painful mycotic nails. -Mechanically debrided and reduced mycotic nails with sterile nail nipper and dremel nail file without incident. -Recommend good supportive shoes daily. -Patient to return in 3 months for follow up evaluation or sooner if symptoms worsen.  Landis Martins, DPM

## 2016-05-27 ENCOUNTER — Other Ambulatory Visit: Payer: Self-pay | Admitting: Internal Medicine

## 2016-07-27 ENCOUNTER — Ambulatory Visit (INDEPENDENT_AMBULATORY_CARE_PROVIDER_SITE_OTHER): Payer: Medicare Other | Admitting: Internal Medicine

## 2016-07-27 ENCOUNTER — Encounter: Payer: Self-pay | Admitting: Internal Medicine

## 2016-07-27 DIAGNOSIS — R278 Other lack of coordination: Secondary | ICD-10-CM

## 2016-07-27 DIAGNOSIS — E441 Mild protein-calorie malnutrition: Secondary | ICD-10-CM | POA: Diagnosis not present

## 2016-07-27 MED ORDER — DONEPEZIL HCL 5 MG PO TABS: 5.0000 mg | ORAL_TABLET | Freq: Every day | ORAL | 11 refills | Status: DC

## 2016-07-27 NOTE — Assessment & Plan Note (Signed)
Ongoing issues Planning PT via [facility] to help with balance

## 2016-07-27 NOTE — Progress Notes (Signed)
Subjective:    Patient ID: Corey Clark, male    DOB: November 10, 1924, 82 y.o.   MRN: [id number]  HPI Here with wife due to loss of weight and less energy Notes a decrease in appetite --doesn't even want foods he used to like Still mild instability with walking--uses cane  Was started on namenda by Dr Melrose Nakayama In May No clear change or problems--- wife thinks it may have helped She thinks the donepezil may have helped some He tried stopping the medication on his own after reading a Consumer Reports article He seemed to decline off the medication  Did try 1 boost--but first thing in AM and then he didn't eat breakfast Discussed trying this inbetween meals  Current Outpatient Prescriptions on File Prior to Visit  Medication Sig Dispense Refill  . CARAFATE 1 GM/10ML suspension TAKE 10 MLS (1 GRAM TOTAL) TWO TIMES DAILY 1680 mL 2  . donepezil (ARICEPT) 10 MG tablet TAKE 1 TABLET AT BEDTIME AS NEEDED 90 tablet 3  . Multiple Vitamin (MULTIVITAMIN) tablet Take 1 tablet by mouth daily.    Marland Kitchen omeprazole (PRILOSEC OTC) 20 MG tablet Take 20 mg by mouth daily.    Marland Kitchen triamcinolone cream (KENALOG) 0.1 %      No current facility-administered medications on file prior to visit.     Allergies  Allergen Reactions  . Tetracyclines & Related Other (See Comments)    Patient not sure it's been years since he's taken med    Past Medical History:  Diagnosis Date  . BPH (benign prostatic hypertrophy)   . Colon polyps   . GERD (gastroesophageal reflux disease)   . Neuropathy due to drug [facility])    chemo for stomach cancer  . Osteoarthrosis involving, or with mention of more than one site, but not specified as generalized, multiple sites   . SCC (squamous cell carcinoma), face 2014   left preauricular---[facility]  . Stomach cancer ([place]) 1999   MALT lymphoma --surgery then chemo. Bad infection after with 1 month hospitalization    Past Surgical History:  Procedure Laterality Date  . CATARACT  EXTRACTION, BILATERAL  2002, 2003  . [place]  . PARTIAL GASTRECTOMY  1999   2 procedures for stomach cancer  . ROTATOR CUFF REPAIR  1991/1998   left and right done  . TENDON RELEASE Left 6/15   Dr Jefm Bryant    Family History  Problem Relation Age of Onset  . Cancer Brother   . Heart disease Neg Hx   . Hypertension Neg Hx   . Diabetes Neg Hx     Social History   Social History  . Marital status: Married    Spouse name: N/A  . Number of children: 3  . Years of education: N/A   Occupational History  . 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 Smoking status: Former Smoker    Quit date: 10/15/1950  . Smokeless tobacco: Never Used  . Alcohol use Yes     Comment: rare wine  . Drug use: No  . Sexual activity: No   Other Topics Concern  . Not on file   Social History Narrative   3 children in [place]      Has living will    Wife, then son, Jamauri Morand., to be health care POA   Would accept resuscitation attempts   Not sure about tube feeds-- probably doesn't want if cognitively  unaware   Review of Systems Sleeps okay--and naps quite a bit Does have some mood issues---mild sadness Still enjoys card games (canasta) and puzzles Not really exercising-- "wears out" so quick    Objective:   Physical Exam  Neurological:  Normal speech and interaction Does have some insight but limited--- some trouble with judgement Not repeating himself much          Assessment & Plan:

## 2016-07-27 NOTE — Progress Notes (Signed)
Pre visit review using our clinic review tool, if applicable. No additional management support is needed unless otherwise documented below in the visit note. 

## 2016-07-27 NOTE — Patient Instructions (Signed)
Please reduce the donepezil to 5mg  daily. Let me know if there is a striking change in memory or function. Try the boost -- 1/2-1 bottle mixed with ice cream---- inbetween meals (2-3 times a day) You can try over the counter vitamin B12 --- 521mcg under the tongue daily.

## 2016-07-27 NOTE — Assessment & Plan Note (Signed)
Ongoing weight loss and poor eating I suspect some of this is due to the donepezil--but wife feels he declined when he stopped it Will add regular boost inbetween meals Try lower dose of donepezil--- 5mg  daily

## 2016-08-09 ENCOUNTER — Encounter: Payer: Self-pay | Admitting: Podiatry

## 2016-08-09 ENCOUNTER — Ambulatory Visit (INDEPENDENT_AMBULATORY_CARE_PROVIDER_SITE_OTHER): Payer: Medicare Other | Admitting: Podiatry

## 2016-08-09 VITALS — BP 111/65 | HR 75 | Resp 16

## 2016-08-09 DIAGNOSIS — M79676 Pain in unspecified toe(s): Secondary | ICD-10-CM | POA: Diagnosis not present

## 2016-08-09 DIAGNOSIS — B351 Tinea unguium: Secondary | ICD-10-CM

## 2016-08-09 DIAGNOSIS — G620 Drug-induced polyneuropathy: Secondary | ICD-10-CM

## 2016-08-09 NOTE — Progress Notes (Signed)
Subjective: 82 y.o. male returns the office today for painful, elongated, thickened toenails which he cannot trim himself. Denies any redness or drainage around the nails. Nails are painful with shoes and socks. Denies any acute changes since last appointment and no new complaints today. Denies any systemic complaints such as fevers, chills, nausea, vomiting.   Objective: AAO 3, NAD DP/PT pulses palpable, CRT less than 3 seconds Decreased sensation with SWMF Nails hypertrophic, dystrophic, elongated, brittle, discolored 10. There is tenderness overlying the nails 1-5 bilaterally. There is no surrounding erythema or drainage along the nail sites. No open lesions or pre-ulcerative lesions are identified. No other areas of tenderness bilateral lower extremities. No overlying edema, erythema, increased warmth. No pain with calf compression, swelling, warmth, erythema.  Assessment: Patient presents with symptomatic onychomycosis, neuropathy   Plan: -Treatment options including alternatives, risks, complications were discussed -Nails sharply debrided 10 without complication/bleeding. -Discussed daily foot inspection. If there are any changes, to call the office immediately.  -Follow-up in 3 months or sooner if any problems are to arise. In the meantime, encouraged to call the office with any questions, concerns, changes symptoms.   Celesta Gentile, DPM

## 2016-08-16 ENCOUNTER — Emergency Department: Payer: Medicare Other

## 2016-08-16 ENCOUNTER — Telehealth: Payer: Self-pay | Admitting: Internal Medicine

## 2016-08-16 ENCOUNTER — Emergency Department
Admission: EM | Admit: 2016-08-16 | Discharge: 2016-08-16 | Disposition: A | Discharge status: Home / Self Care | Payer: Medicare Other | Attending: Emergency Medicine | Admitting: Emergency Medicine

## 2016-08-16 DIAGNOSIS — Z87891 Personal history of nicotine dependence: Secondary | ICD-10-CM | POA: Insufficient documentation

## 2016-08-16 DIAGNOSIS — K5641 Fecal impaction: Secondary | ICD-10-CM

## 2016-08-16 DIAGNOSIS — F0280 Dementia in other diseases classified elsewhere without behavioral disturbance: Secondary | ICD-10-CM | POA: Insufficient documentation

## 2016-08-16 DIAGNOSIS — Z85028 Personal history of other malignant neoplasm of stomach: Secondary | ICD-10-CM | POA: Insufficient documentation

## 2016-08-16 DIAGNOSIS — Z85828 Personal history of other malignant neoplasm of skin: Secondary | ICD-10-CM | POA: Diagnosis not present

## 2016-08-16 DIAGNOSIS — Z79899 Other long term (current) drug therapy: Secondary | ICD-10-CM | POA: Insufficient documentation

## 2016-08-16 DIAGNOSIS — G308 Other Alzheimer's disease: Secondary | ICD-10-CM | POA: Diagnosis not present

## 2016-08-16 DIAGNOSIS — K5900 Constipation, unspecified: Secondary | ICD-10-CM

## 2016-08-16 HISTORY — DX: Alzheimer's disease, unspecified: G30.9

## 2016-08-16 HISTORY — DX: Dementia in other diseases classified elsewhere, unspecified severity, without behavioral disturbance, psychotic disturbance, mood disturbance, and anxiety: F02.80

## 2016-08-16 MED ORDER — LACTULOSE 20 GM/30ML PO SOLN: 30.0000 mL | Freq: Every day | ORAL | 0 refills | Status: AC | PRN

## 2016-08-16 MED ORDER — LACTULOSE 10 GM/15ML PO SOLN
ORAL | Status: AC
  Administered 2016-08-16: 20 g
  Filled 2016-08-16: qty 30

## 2016-08-16 NOTE — Discharge Instructions (Signed)
Please continue to eat a high-fiber diet, drink plenty of water, and remain active for good bowel health. You may take a daily stool softener as needed. If you begin to have more symptoms of constipation, you may try lactulose or an enema that you can buy at the pharmacy.  Return to the emergency department if you develop abdominal pain, bloating sensation, nausea or vomiting, fever, or any other symptoms concerning to you.

## 2016-08-16 NOTE — Telephone Encounter (Signed)
Corey Clark DOB: 1925/08/29 Initial Comment Caller states her husband has Alzheimers and is constipated. Rectum pain. Nurse Assessment Nurse: Vallery Sa, RN, Cathy Date/Time (Eastern Time): 08/16/2016 12:10:59 PM Confirm and document reason for call. If symptomatic, describe symptoms. You must click the next button to save text entered. ---Corey Clark states that [place] last bowel movement was 2-3 days ago. He is trying to have a bowel movement now and is having rectal pain (rated as a 9 on the 1 to 10 scale). No bleeding from rectum. No vomiting. Alert and responsive. Has the patient traveled out of the country within the last 30 days? ---No Does the patient have any new or worsening symptoms? ---Yes Will a triage be completed? ---Yes Related visit to physician within the last 2 weeks? ---No Does the PT have any chronic conditions? (i.e. diabetes, asthma, etc.) ---Yes List chronic conditions. ---Alzheimer'Clark, Stomach Cancer 1999, Low B12 level Is this a behavioral health or substance abuse call? ---No Guidelines Guideline Title Affirmed Question Affirmed Notes Rectal Symptoms SEVERE rectal pain (e.g., excruciating, unable to have a bowel movement) Final Disposition User See Physician within 4 Hours (or PCP triage) Vallery Sa, RN, [place] Comments No appointment available at [facility] or [facility]. Caller declined further office appointment options and plans to take him to the ER in [place]. Encouraged to call back as needed. Referrals GO TO FACILITY OTHER - SPECIFY Disagree/Comply: Disagree Disagree/Comply Reason: Disagree with instructions

## 2016-08-16 NOTE — ED Notes (Signed)
Spoke with Dr. Burlene Arnt regarding patient, orders received.

## 2016-08-16 NOTE — ED Triage Notes (Signed)
Pt unable to have BM X 3 days. Took suppository X 2 with no relief. Pt alert and oriented X4, active, cooperative, pt in NAD. RR even and unlabored, color WNL.

## 2016-08-16 NOTE — ED Notes (Signed)
Pt c/o "runny"/"seeping" stools today.  Denies n/v, sts has had issues w/ blockages in past that have resolved.

## 2016-08-16 NOTE — ED Notes (Signed)
Patient transported to X-ray 

## 2016-08-16 NOTE — ED Provider Notes (Signed)
[facility] Emergency Department Provider Note  ____________________________________________  Time seen: Approximately 3:46 PM  I have reviewed the triage vital signs and the nursing notes.   HISTORY  Chief Complaint Constipation    HPI Corey Clark is a 82 y.o. male with a remote history of gastric cancer status post resection presenting for constipation. The patient reports that he generally has a bowel movement every 1-2 days. For the past 3 days, he has been unable to have a bowel movement. He describes an urge, but no output. Today, he tried a glycerin suppository which resulted in some liquid stool but only a small amount. He has not had any abdominal discomfort, nausea or vomiting, fever, or abdominal distention. He reports high fiber diet.   Past Medical History:  Diagnosis Date  . Alzheimer's dementia   . BPH (benign prostatic hypertrophy)   . Colon polyps   . GERD (gastroesophageal reflux disease)   . 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 Osteoarthrosis involving, or with mention of more than one site, but not specified as generalized, multiple sites   . SCC (squamous cell carcinoma), face 2014   left preauricular---[facility]  . Stomach cancer ([place]) 1999   MALT lymphoma --surgery then chemo. Bad infection after with 1 month hospitalization    Patient Active Problem List   Diagnosis Date Noted  . Mild malnutrition ([place]) 07/27/2016  . Alzheimer's dementia, late onset 08/31/2015  . 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 Cough 07/25/2015  . Advance directive discussed with patient 01/17/2015  . Non-allergic rhinitis 06/29/2014  . Abnormality of lung on CXR 06/29/2014  . Trigger finger 04/07/2014  . [facility] DJD(carpometacarpal degenerative joint disease), localized primary 07/13/2013  . H/O malignant neoplasm of skin 06/29/2013  . BPH (benign prostatic hypertrophy)   . Routine general medical examination at a health care facility  05/30/2012  . Other constipation 11/26/2011  . GERD (gastroesophageal reflux disease)   . Osteoarthritis, multiple sites   . Neuropathy due to drug [facility])     Past Surgical History:  Procedure Laterality Date  . CATARACT EXTRACTION, BILATERAL  2002, 2003  . [name]  . PARTIAL GASTRECTOMY  1999   2 procedures for stomach cancer  . ROTATOR CUFF REPAIR  1991/1998   left and right done  . TENDON RELEASE Left 6/15   Dr Jefm Bryant    Current Outpatient Rx  . Order #: [id number] Class: Normal  . Order #: [id number] Class: Historical Med  . Order #: [id number] Class: Historical Med  . Order #: [id number] Class: Historical Med  . Order #: [id number] Class: Print    Allergies Tetracyclines & related  Family History  Problem Relation Age of Onset  . Cancer Brother   . Heart disease Neg Hx   . Hypertension Neg Hx   . Diabetes Neg Hx     Social History Social History  Substance Use Topics  . Smoking status: Former Smoker    Quit date: 10/15/1950  . Smokeless tobacco: Never Used  . Alcohol use Yes     Comment: rare wine    Review of Systems Constitutional: No fever/chills. ENT: No sore throat. No congestion or rhinorrhea. Cardiovascular: Denies chest pain. Denies palpitations. Respiratory: Denies shortness of breath.  No cough. Gastrointestinal: No abdominal pain.  No nausea, no vomiting.  No diarrhea.  Positive constipation. No abdominal distention. No anorexia. Genitourinary: Negative for dysuria. Musculoskeletal: Negative for back pain. Skin: Negative for rash. Neurological: Negative  for headaches. No focal numbness, tingling or weakness.   10-point ROS otherwise negative.  ____________________________________________   PHYSICAL EXAM:  VITAL SIGNS: ED Triage Vitals  Enc Vitals Group     BP 08/16/16 1322 (!) 146/77     Pulse Rate 08/16/16 1322 88     Resp 08/16/16 1322 18     Temp 08/16/16 1324 97.5 F (36.4 C)     Temp Source 08/16/16 1322 Oral      SpO2 08/16/16 1322 97 %     Weight 08/16/16 1323 165 lb (74.8 kg)     Height --      Head Circumference --      Peak Flow --      Pain Score 08/16/16 1323 6     Pain Loc --      Pain Edu? --      Excl. in [place]? --     Constitutional: Alert And answering questions appropriately. Well appearing and in no acute distress. Eyes: Conjunctivae are normal.  EOMI. No scleral icterus. Head: Atraumatic. Nose: No congestion/rhinnorhea. Mouth/Throat: Mucous membranes are moist.  Neck: No stridor.  Supple.   Cardiovascular: Normal rate Respiratory: Normal respiratory effort.    Gastrointestinal: Soft, nontender and mildly distended.  No guarding or rebound.  No peritoneal signs. Genitourinary: Rectal examination without external or palpable internal hemorrhoids. No pain with rectal examination. Palpable soft stool in the rectal vault that is brown without blood, as well as oozing of liquid stool. Musculoskeletal: No LE edema.  Neurologic:  Alert.  Speech is clear.  Face and smile are symmetric.  EOMI.  Moves all extremities well. Skin:  Skin is warm, dry and intact. No rash noted. Psychiatric: Mood and affect are normal. Speech and behavior are normal.  Normal judgement.  ____________________________________________   LABS (all labs ordered are listed, but only abnormal results are displayed)  Labs Reviewed - No data to display ____________________________________________  EKG  Not indicated ____________________________________________  RADIOLOGY  Dg Abdomen 1 View  Result Date: 08/16/2016 CLINICAL DATA:  Constipation for 3 days.  Abdominal pain for 1 day. EXAM: ABDOMEN - 1 VIEW COMPARISON:  03/21/2014 FINDINGS: Large amount of stool in the rectum. Nonobstructive bowel gas pattern. Bridging osteophytes throughout the lumbar spine. No large abdominal calcifications. IMPRESSION: Normal bowel gas pattern. Large amount of stool in the rectum. Electronically Signed   By: Markus Daft M.D.    On: 08/16/2016 14:33    ____________________________________________   PROCEDURES  Procedure(s) performed: None  Procedures  Critical Care performed: No ____________________________________________   INITIAL IMPRESSION / ASSESSMENT AND PLAN / ED COURSE  Pertinent labs & imaging results that were available during my care of the patient were reviewed by me and considered in my medical decision making (see chart for details).  82 y.o. male presenting with constipation. On my examination, the patient has mild distention but no focal pain. He has not been having any nausea or vomiting, does not have any evidence of obstructive changes on his plain x-ray. Ileus or obstruction is less likely than constipation. During my rectal examination, I have disimpacted the stool that I was able to from the rectal vault, and additionally we'll treat the patient with lactulose and a soapsuds enema. Afterwards, we discussed a daily stool softener, lactulose as needed, and continuing a high-fiber diet. The patient will follow up with his primary care physician. Return precautions were discussed and the patient and his wife understand red flag symptoms that would be concerning for an  acute intra-abdominal process.  ____________________________________________  FINAL CLINICAL IMPRESSION(S) / ED DIAGNOSES  Final diagnoses:  Constipation, unspecified constipation type  Fecal impaction (HCC)    Clinical Course      NEW MEDICATIONS STARTED DURING THIS VISIT:  New Prescriptions   LACTULOSE 20 GM/30ML SOLN    Take 30 mLs (20 g total) by mouth daily as needed.      Eula Listen, MD 08/16/16 1553

## 2016-08-16 NOTE — Telephone Encounter (Signed)
Will await the disposition from the ER

## 2016-08-16 NOTE — Telephone Encounter (Signed)
Per chart review tab pt is at [facility] ED now. 

## 2016-08-21 ENCOUNTER — Encounter: Payer: Self-pay | Admitting: Internal Medicine

## 2016-08-21 ENCOUNTER — Ambulatory Visit (INDEPENDENT_AMBULATORY_CARE_PROVIDER_SITE_OTHER): Payer: Medicare Other | Admitting: Internal Medicine

## 2016-08-21 VITALS — BP 106/60 | HR 78 | Temp 97.6°F | Wt 163.0 lb

## 2016-08-21 DIAGNOSIS — K5909 Other constipation: Secondary | ICD-10-CM | POA: Diagnosis not present

## 2016-08-21 DIAGNOSIS — G301 Alzheimer's disease with late onset: Secondary | ICD-10-CM

## 2016-08-21 DIAGNOSIS — R278 Other lack of coordination: Secondary | ICD-10-CM

## 2016-08-21 DIAGNOSIS — F0280 Dementia in other diseases classified elsewhere without behavioral disturbance: Secondary | ICD-10-CM | POA: Diagnosis not present

## 2016-08-21 NOTE — Assessment & Plan Note (Signed)
Asked him to use the walker Handicapped permit

## 2016-08-21 NOTE — Assessment & Plan Note (Signed)
No cognitive change on the decreased donepezil

## 2016-08-21 NOTE — Progress Notes (Signed)
Pre visit review using our clinic review tool, if applicable. No additional management support is needed unless otherwise documented below in the visit note. 

## 2016-08-21 NOTE — Assessment & Plan Note (Signed)
Better now with increased fluids Discussed lactulose or miralax prn

## 2016-08-21 NOTE — Progress Notes (Signed)
Subjective:    Patient ID: Corey Clark, male    DOB: Jul 23, 1925, 82 y.o.   MRN: [id number]  HPI Here with wife for follow up of dementia and trouble with appetite  Reviewed recent ER visit Constipated and rectal pain-- "like I was sitting on a rock" Rectal urgency, etc Got lactulose and enema and got cleaned out Thinks he has not been drinking enough water--now has increased his intake Bowels are now better Used only 2 doses of the lactulose He doesn't remember how often he is going--but no symptoms now  Appetite is some better now Wife not impressed though Has taken the boost with ice cream--discussed that this should be in between meals  He is taking the lower dose of donepezil This change got "all jumbled up in everything else" No clear cognitive changes with the lower dose though  He is back to doing all his ADLs Needed some help when weak Wife does provide stand by for showers Has alert button also  Current Outpatient Prescriptions on File Prior to Visit  Medication Sig Dispense Refill  . donepezil (ARICEPT) 5 MG tablet Take 1 tablet (5 mg total) by mouth at bedtime. 30 tablet 11  . Lactulose 20 GM/30ML SOLN Take 30 mLs (20 g total) by mouth daily as needed. 60 mL 0  . memantine (NAMENDA) 5 MG tablet Take 2 tablets by mouth daily.    . Multiple Vitamin (MULTIVITAMIN) tablet Take 1 tablet by mouth daily.    Marland Kitchen omeprazole (PRILOSEC OTC) 20 MG tablet Take 20 mg by mouth daily.     No current facility-administered medications on file prior to visit.     Allergies  Allergen Reactions  . Tetracyclines & Related Other (See Comments)    Patient not sure it's been years since he's taken med    Past Medical History:  Diagnosis Date  . Alzheimer's dementia   . BPH (benign prostatic hypertrophy)   . Colon polyps   . GERD (gastroesophageal reflux disease)   . Neuropathy due to drug [facility])    chemo for stomach cancer  . Osteoarthrosis involving, or with mention of  more than one site, but not specified as generalized, multiple sites   . SCC (squamous cell carcinoma), face 2014   left preauricular---[facility]  . Stomach cancer ([place]) 1999   MALT lymphoma --surgery then chemo. Bad infection after with 1 month hospitalization    Past Surgical History:  Procedure Laterality Date  . CATARACT EXTRACTION, BILATERAL  2002, 2003  . [place]  . PARTIAL GASTRECTOMY  1999   2 procedures for stomach cancer  . ROTATOR CUFF REPAIR  1991/1998   left and right done  . TENDON RELEASE Left 6/15   Dr Jefm Bryant    Family History  Problem Relation Age of Onset  . Cancer Brother   . Heart disease Neg Hx   . Hypertension Neg Hx   . Diabetes Neg Hx     Social History   Social History  . Marital status: Married    Spouse name: N/A  . Number of children: 3  . Years of education: N/A   Occupational History  . 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 Smoking status: Former Smoker    Quit date: 10/15/1950  . Smokeless tobacco: Never Used  . Alcohol use Yes     Comment: rare wine  . Drug use: No  .  Sexual activity: No   Other Topics Concern  . Not on file   Social History Narrative   3 children in [place]      Has living will    Wife, then son, Zyran Eyer., to be health care POA   Would accept resuscitation attempts   Not sure about tube feeds-- probably doesn't want if cognitively unaware   Review of Systems Was very weak after the ER visit--needed walker Now just using cane Trouble getting out now-- requests handicapped permit Never got the PT set up    Objective:   Physical Exam  Abdominal: Soft. He exhibits no distension. There is no tenderness. There is no rebound and no guarding.  Neurological:  Limited insight Repeats himself some          Assessment & Plan:

## 2016-08-21 NOTE — Patient Instructions (Signed)
Please use the walker all the time.

## 2016-08-27 ENCOUNTER — Telehealth: Payer: Self-pay | Admitting: *Deleted

## 2016-08-27 NOTE — Telephone Encounter (Signed)
Form was brought in to be filled out. Please call when it is ready for pick up [phone]. Form placed in prescription tower.

## 2016-08-28 ENCOUNTER — Telehealth: Payer: Self-pay

## 2016-08-28 NOTE — Telephone Encounter (Signed)
[employer] [facility] sales representative at [facility] left v/m; Dr Silvio Pate wrote PT rx on 07/02/16; and getting OKed thru ins pt has not started PT yet and due to medicare guidelines needs new PT for balance and gait rx faxed to [phone].

## 2016-08-28 NOTE — Telephone Encounter (Signed)
Forms placed in Dr Letvak's Inbox on his desk 

## 2016-08-29 NOTE — Telephone Encounter (Signed)
Order rewritten Please fax

## 2016-08-29 NOTE — Telephone Encounter (Signed)
Order faxed to [facility]

## 2016-08-30 NOTE — Telephone Encounter (Signed)
May need PPD

## 2016-08-30 NOTE — Telephone Encounter (Signed)
Spoke to pt's wife, [name]. She will see if someone at [facility] will read his TB test. Otherwise, I will plan on having him come in tomorrow to place it and come back on Monday to read it.

## 2016-08-30 NOTE — Telephone Encounter (Signed)
Patient's wife,Nancy,returned Shannon's call.  [name] can be reached at [phone].  She said she'd be sitting by the phone.

## 2016-08-30 NOTE — Telephone Encounter (Signed)
Pt will come in tomorrow morning and I willplace his TB test. The Nurse at [facility] will read it for him on Sunday.

## 2016-08-31 ENCOUNTER — Ambulatory Visit (INDEPENDENT_AMBULATORY_CARE_PROVIDER_SITE_OTHER): Payer: Medicare Other

## 2016-08-31 DIAGNOSIS — Z111 Encounter for screening for respiratory tuberculosis: Secondary | ICD-10-CM

## 2016-09-11 ENCOUNTER — Encounter: Payer: Self-pay | Admitting: Primary Care

## 2016-09-11 ENCOUNTER — Telehealth: Payer: Self-pay | Admitting: Internal Medicine

## 2016-09-11 ENCOUNTER — Ambulatory Visit (INDEPENDENT_AMBULATORY_CARE_PROVIDER_SITE_OTHER): Payer: Medicare Other | Admitting: Primary Care

## 2016-09-11 DIAGNOSIS — K5909 Other constipation: Secondary | ICD-10-CM | POA: Diagnosis not present

## 2016-09-11 NOTE — Progress Notes (Signed)
Subjective:    Patient ID: Corey Clark, male    DOB: Jul 10, 1925, 82 y.o.   MRN: [id number]  HPI  Corey Clark is a 82 year old male with a history of constipation, GERD, Alzheimer's Dementia, gastric cancer status post resection in the 90's who presents today with a chief complaint of constipation. He underwent evaluation in the emergency department in early November for constipation and was treated with Lactulose and mechanical disimpaction. He also underwent xray of his abdomen which was negative for blockage and positive for large stool burden.  His constipation has been intermittent for years, but his recent episode has been present for the past 3 days. He's had no bowel movement in 3 days and is feeling very uncomfortable. He inserted a glycerin suppository today and has not had a bowel movement. He endorses a high fiber diet and tried to drink plenty of water daily.  His diet currently consists of: Breakfast: Oatmeal with raisins, fruit, coffee, orange juice Lunch: Fruit, granola bars Dinner: Boxed meals, salad, meat, rice Snacks: None Desserts: Ice cream with boost Beverages: Water (4-5 glasses daily), coffee, juice  Exercise: He is not active as he sits most of the day. Currently undergoing PT.  Review of Systems  Constitutional: Negative for chills and fever.  Gastrointestinal: Positive for constipation. Negative for abdominal pain, blood in stool, diarrhea, nausea and vomiting.       Past Medical History:  Diagnosis Date  . Alzheimer's dementia   . BPH (benign prostatic hypertrophy)   . Colon polyps   . GERD (gastroesophageal reflux disease)   . Neuropathy due to drug [facility])    chemo for stomach cancer  . Osteoarthrosis involving, or with mention of more than one site, but not specified as generalized, multiple sites   . SCC (squamous cell carcinoma), face 2014   left preauricular---[facility]  . Stomach cancer ([place]) 1999   MALT lymphoma --surgery then chemo. Bad  infection after with 1 month hospitalization     Social History   Social History  . Marital status: Married    Spouse name: N/A  . Number of children: 3  . Years of education: N/A   Occupational History  . 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 Smoking status: Former Smoker    Quit date: 10/15/1950  . Smokeless tobacco: Never Used  . Alcohol use Yes     Comment: rare wine  . Drug use: No  . Sexual activity: No   Other Topics Concern  . Not on file   Social History Narrative   3 children in [place]      Has living will    Wife, then son, Petra Whittiker., to be health care POA   Would accept resuscitation attempts   Not sure about tube feeds-- probably doesn't want if cognitively unaware    Past Surgical History:  Procedure Laterality Date  . CATARACT EXTRACTION, BILATERAL  2002, 2003  . [place]  . PARTIAL GASTRECTOMY  1999   2 procedures for stomach cancer  . ROTATOR CUFF REPAIR  1991/1998   left and right done  . TENDON RELEASE Left 6/15   Dr Jefm Bryant    Family History  Problem Relation Age of Onset  . Cancer Brother   . Heart disease Neg Hx   . Hypertension Neg Hx   . Diabetes Neg Hx     Allergies  Allergen  Reactions  . Tetracyclines & Related Other (See Comments)    Patient not sure it's been years since he's taken med    Current Outpatient Prescriptions on File Prior to Visit  Medication Sig Dispense Refill  . donepezil (ARICEPT) 5 MG tablet Take 1 tablet (5 mg total) by mouth at bedtime. 30 tablet 11  . Lactulose 20 GM/30ML SOLN Take 30 mLs (20 g total) by mouth daily as needed. 60 mL 0  . memantine (NAMENDA) 5 MG tablet Take 2 tablets by mouth daily.    . Multiple Vitamin (MULTIVITAMIN) tablet Take 1 tablet by mouth daily.     No current facility-administered medications on file prior to visit.     BP 122/64   Pulse 74   Temp 97.8 F (36.6 C) (Oral)   Wt 163 lb 12.8 oz  (74.3 kg)   SpO2 95%   BMI 24.19 kg/m    Objective:   Physical Exam  Constitutional: He appears well-nourished.  Neck: Neck supple.  Cardiovascular: Normal rate.   Pulmonary/Chest: Effort normal and breath sounds normal.  Abdominal: Soft. Bowel sounds are normal. There is no tenderness.  Firm stool in rectum which was removed by mechanical disimpaction. Chaperone present.   Skin: Skin is warm and dry.          Assessment & Plan:

## 2016-09-11 NOTE — Progress Notes (Signed)
Pre visit review using our clinic review tool, if applicable. No additional management support is needed unless otherwise documented below in the visit note. 

## 2016-09-11 NOTE — Patient Instructions (Signed)
Ensure you are staying hydrated with water. Try to drink at least 6-8 glasses of water daily.  Exercise such as walking daily will help to move your bowels.   Continue working on improvements in your diet. Take a look at the examples below.  Try Miralax. Mix 1 capful of powder into 8 ounces of water once daily as needed for constipation in the future.   It was a pleasure meeting you!   High-Fiber Diet Fiber, also called dietary fiber, is a type of carbohydrate found in fruits, vegetables, whole grains, and beans. A high-fiber diet can have many health benefits. Your health care provider may recommend a high-fiber diet to help:  Prevent constipation. Fiber can make your bowel movements more regular.  Lower your cholesterol.  Relieve hemorrhoids, uncomplicated diverticulosis, or irritable bowel syndrome.  Prevent overeating as part of a weight-loss plan.  Prevent heart disease, type 2 diabetes, and certain cancers. What is my plan? The recommended daily intake of fiber includes:  38 grams for men under age 32.  24 grams for men over age 89.  70 grams for women under age 23.  70 grams for women over age 44. You can get the recommended daily intake of dietary fiber by eating a variety of fruits, vegetables, grains, and beans. Your health care provider may also recommend a fiber supplement if it is not possible to get enough fiber through your diet. What do I need to know about a high-fiber diet?  Fiber supplements have not been widely studied for their effectiveness, so it is better to get fiber through food sources.  Always check the fiber content on thenutrition facts label of any prepackaged food. Look for foods that contain at least 5 grams of fiber per serving.  Ask your dietitian if you have questions about specific foods that are related to your condition, especially if those foods are not listed in the following section.  Increase your daily fiber consumption gradually.  Increasing your intake of dietary fiber too quickly may cause bloating, cramping, or gas.  Drink plenty of water. Water helps you to digest fiber. What foods can I eat? Grains  Whole-grain breads. Multigrain cereal. Oats and oatmeal. Brown rice. Barley. Bulgur wheat. [place]. Bran muffins. Popcorn. Rye wafer crackers. Vegetables  Sweet potatoes. Spinach. Kale. Artichokes. Cabbage. Broccoli. Green peas. Carrots. Squash. Fruits  Berries. Pears. Apples. Oranges. Avocados. Prunes and raisins. Dried figs. Meats and Other Protein Sources  Navy, kidney, pinto, and soy beans. Split peas. Lentils. Nuts and seeds. Dairy  Fiber-fortified yogurt. Beverages  Fiber-fortified soy milk. Fiber-fortified orange juice. Other  Fiber bars. The items listed above may not be a complete list of recommended foods or beverages. Contact your dietitian for more options.  What foods are not recommended? Grains  White bread. Pasta made with refined flour. White rice. Vegetables  Fried potatoes. Canned vegetables. Well-cooked vegetables. Fruits  Fruit juice. Cooked, strained fruit. Meats and Other Protein Sources  Fatty cuts of meat. Fried Sales executive or fried fish. Dairy  Milk. Yogurt. Cream cheese. Sour cream. Beverages  Soft drinks. Other  Cakes and pastries. Butter and oils. The items listed above may not be a complete list of foods and beverages to avoid. Contact your dietitian for more information.  What are some tips for including high-fiber foods in my diet?  Eat a wide variety of high-fiber foods.  Make sure that half of all grains consumed each day are whole grains.  Replace breads and cereals made from refined  flour or white flour with whole-grain breads and cereals.  Replace white rice with brown rice, bulgur wheat, or millet.  Start the day with a breakfast that is high in fiber, such as a cereal that contains at least 5 grams of fiber per serving.  Use beans in place of meat in soups,  salads, or pasta.  Eat high-fiber snacks, such as berries, raw vegetables, nuts, or popcorn. This information is not intended to replace advice given to you by your health care provider. Make sure you discuss any questions you have with your health care provider. Document Released: 10/01/2005 Document Revised: 03/08/2016 Document Reviewed: 03/16/2014 Elsevier Interactive Patient Education  2017 [employer].

## 2016-09-11 NOTE — Telephone Encounter (Signed)
Noted.  Will evaluate. 

## 2016-09-11 NOTE — Telephone Encounter (Signed)
Patient Name: Corey Clark DOB: [birth year] Initial Comment Caller states her husband is constipated, suppository not working Nurse Assessment Nurse: Ronnald Ramp, RN, Miranda Date/Time (Eastern Time): 09/11/2016 12:45:11 PM Confirm and document reason for call. If symptomatic, describe symptoms. You must click the next button to save text entered. ---Caller states her husband is constipated and having alot of pain. Last BM was 3 days ago. He gave himself a suppository about 2 hrs ago with no results. Does the patient have any new or worsening symptoms? ---Yes Will a triage be completed? ---Yes Related visit to physician within the last 2 weeks? ---No Does the PT have any chronic conditions? (i.e. diabetes, asthma, etc.) ---Yes List chronic conditions. ---Alzheimer's, Neuropathy Is this a behavioral health or substance abuse call? ---No Guidelines Guideline Title Affirmed Question Affirmed Notes Constipation [1] Sudden onset rectal pain (straining, rectal pressure or fullness) AND [2] NOT better after SITZ bath, suppository or enema Final Disposition User See Physician within 4 Hours (or PCP triage) Ronnald Ramp, RN, Miranda Comments Appt scheduled with Alma Friendly, NP at 3:45pm Referrals REFERRED TO PCP OFFICE Disagree/Comply: Comply

## 2016-09-11 NOTE — Assessment & Plan Note (Signed)
Constipation x 3 days. No improvement with glycerin suppository today. Firm stool to rectum, this was removed by mechanical disimpaction today. Patient able to have full bowel movement shortly after in the clinic. Discussed use of Miralax daily/PRN for future constipation. Discussed to avoid laxatives given risk for dehydration. Handout provided regarding high fiber diet. Patient and his wife verbalized understanding.

## 2016-09-19 ENCOUNTER — Other Ambulatory Visit: Payer: Self-pay | Admitting: Otolaryngology

## 2016-09-19 DIAGNOSIS — R1312 Dysphagia, oropharyngeal phase: Secondary | ICD-10-CM

## 2016-10-04 ENCOUNTER — Ambulatory Visit
Admission: RE | Admit: 2016-10-04 | Discharge: 2016-10-04 | Disposition: A | Discharge status: Home / Self Care | Payer: Medicare Other | Source: Ambulatory Visit | Attending: Otolaryngology | Admitting: Otolaryngology

## 2016-10-04 DIAGNOSIS — G301 Alzheimer's disease with late onset: Secondary | ICD-10-CM | POA: Diagnosis not present

## 2016-10-04 DIAGNOSIS — F0280 Dementia in other diseases classified elsewhere without behavioral disturbance: Secondary | ICD-10-CM | POA: Diagnosis not present

## 2016-10-04 DIAGNOSIS — R1313 Dysphagia, pharyngeal phase: Secondary | ICD-10-CM | POA: Diagnosis present

## 2016-10-04 NOTE — Therapy (Signed)
[name], [place], [address] Phone: [phone]   Fax:     Modified Barium Swallow  Patient Details  Name: Corey Clark MRN: [id number] Date of Birth: [birth year] No Data Recorded  Encounter Date: 10/04/2016      End of Session - 10/04/16 1607    Visit Number 1   Number of Visits 1   Date for SLP Re-Evaluation 10/04/16   SLP Start Time 1245   SLP Stop Time  1330   SLP Time Calculation (min) 45 min   Activity Tolerance Patient tolerated treatment well      Past Medical History:  Diagnosis Date  . Alzheimer's dementia   . BPH (benign prostatic hypertrophy)   . Colon polyps   . GERD (gastroesophageal reflux disease)   . Neuropathy due to drug [facility])    chemo for stomach cancer  . Osteoarthrosis involving, or with mention of more than one site, but not specified as generalized, multiple sites   . SCC (squamous cell carcinoma), face 2014   left preauricular---[facility]  . Stomach cancer ([place]) 1999   MALT lymphoma --surgery then chemo. Bad infection after with 1 month hospitalization    Past Surgical History:  Procedure Laterality Date  . CATARACT EXTRACTION, BILATERAL  2002, 2003  . [place]  . PARTIAL GASTRECTOMY  1999   2 procedures for stomach cancer  . ROTATOR CUFF REPAIR  1991/1998   left and right done  . TENDON RELEASE Left 6/15   Dr Jefm Bryant    There were no vitals filed for this visit.    Subjective: Patient behavior: (alertness, ability to follow instructions, etc.): Patient able to follow directions for compensatory strategies and complete sample swallowing exercises  Chief complaint: concern for oropharyngeal dysphagia with new neurological diagnosis (Alzheimer's)   Objective:  Radiological Procedure: A videoflouroscopic evaluation of oral-preparatory, reflex initiation, and pharyngeal phases of the swallow was performed; as well as a  screening of the upper esophageal phase.  I. POSTURE: Upright in MBS chair  II. VIEW: Lateral  III. COMPENSATORY STRATEGIES: chin down, swallow, throat clear, swallow again- effective in preventing aspiration  IV. BOLUSES ADMINISTERED:   Thin Liquid: 3 small sips   Nectar-thick Liquid: 1 moderate sip    Puree: 2 teaspoon presentations   Mechanical Soft: 1/4 graham cracker in applesauce  V. RESULTS OF EVALUATION: A. ORAL PREPARATORY PHASE: (The lips, tongue, and velum are observed for strength and coordination)       **Overall Severity Rating: Within normal limits  B. SWALLOW INITIATION/REFLEX: (The reflex is normal if "triggered" by the time the bolus reached the base of the tongue)  **Overall Severity Rating: Mild-moderate; triggers while falling from the valleculae to the pyriform sinuses  C. PHARYNGEAL PHASE: (Pharyngeal function is normal if the bolus shows rapid, smooth, and continuous transit through the pharynx and there is no pharyngeal residue after the swallow)  **Overall Severity Rating: Moderate; decreased tongue base retraction, decreased hyolaryngeal movement, incomplete epiglottic inversion, vallecular residue (solid > liquid)  D. LARYNGEAL PENETRATION: (Material entering into the laryngeal inlet/vestibule but not aspirated) with liquid  E. ASPIRATION: Silent aspiration (trace) or thin liquid laryngeal vestibule residue  F. ESOPHAGEAL PHASE: (Screening of the upper esophagus) No significant abnormality noted in the viewable esophagus  ASSESSMENT: 82 year old man; with recent diagnosis of Alzheimer's, vocal quality change, unintentional weight loss, and concern for oropharyngeal dysphagia; is presenting with moderate pharyngeal dysphagia characterized  by decreased tongue base retraction, decreased hyolaryngeal movement (elevation and anterior movement), incomplete epiglottic inversion, and moderate vallecular residue of solids (mild with liquids).  There was no  observed laryngeal penetration/aspiration of solids.  There is laryngeal penetration without aspiration of nectar-thick liquid.  There is laryngeal penetration with trace aspiration of laryngeal vestibule residue with thin liquids.  A chin down posture with throat clear and second swallow prevents aspiration.  The patient was able to effectively masticate a hard cracker within a moist substrate.  Recommend continue with regular diet using strategies (chin down, throat clear, dry swallow) for liquids.  The results and recommendations were discussed with the patient and his wife and they were provided with written results and recommendations at the time of the study.  The patient would benefit from swallowing exercises to improve hyolaryngeal movement and pharyngeal clearance.  These exercise may improve vocal quality as well.  The patient and his wife expressed interest in pursuing speech therapy for swallowing and voice.    PLAN/RECOMMENDATIONS:   A. Diet: Regular, thin liquid with strategies discussed   B. Swallowing Precautions: chin down for liquids   C. Recommended consultation to: follow up with ENT as recommended   D. Therapy recommendations: speech therapy for swallowing exercises    E. Results and recommendations were discussed with the patient and his wife immediately following the study and the final report routed to the referring MD.   Patient will benefit from skilled therapeutic intervention in order to improve the following deficits and impairments:   Pharyngeal dysphagia - Plan: DG OP Swallowing Func-Medicare/Speech Path, DG OP Swallowing Func-Medicare/Speech Path      G-Codes - Oct 23, 2016 1608    Functional Assessment Tool Used MBSS, clinical judgment   Functional Limitations Swallowing   Swallow Current Status [id number]) At least 40 percent but less than 60 percent impaired, limited or restricted   Swallow Goal Status [id number]) At least 40 percent but less than 60 percent impaired,  limited or restricted   Swallow Discharge Status [id number]) At least 40 percent but less than 60 percent impaired, limited or restricted          Problem List Patient Active Problem List   Diagnosis Date Noted  . Mild malnutrition ([place]) 07/27/2016  . Alzheimer's dementia, late onset 08/31/2015  . 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 Cough 07/25/2015  . Advance directive discussed with patient 01/17/2015  . Non-allergic rhinitis 06/29/2014  . Abnormality of lung on CXR 06/29/2014  . Trigger finger 04/07/2014  . [facility] DJD(carpometacarpal degenerative joint disease), localized primary 07/13/2013  . H/O malignant neoplasm of skin 06/29/2013  . BPH (benign prostatic hypertrophy)   . Routine general medical examination at a health care facility 05/30/2012  . Other constipation 11/26/2011  . GERD (gastroesophageal reflux disease)   . Osteoarthritis, multiple sites   . Neuropathy due to drug [facility])    [name], MS/CCC- SLP  [name] 2016/10/23, 4:09 PM  [facility] [place], [place], [address] Phone: [phone]   Fax:     Name: MAURILIO LONER MRN: [id number] Date of Birth: Mar 29, 1925

## 2016-10-23 ENCOUNTER — Encounter: Payer: Self-pay | Admitting: Internal Medicine

## 2016-10-23 ENCOUNTER — Ambulatory Visit (INDEPENDENT_AMBULATORY_CARE_PROVIDER_SITE_OTHER): Payer: Medicare Other | Admitting: Internal Medicine

## 2016-10-23 VITALS — BP 102/70 | HR 85 | Wt 159.0 lb

## 2016-10-23 DIAGNOSIS — G301 Alzheimer's disease with late onset: Secondary | ICD-10-CM | POA: Diagnosis not present

## 2016-10-23 DIAGNOSIS — F39 Unspecified mood [affective] disorder: Secondary | ICD-10-CM | POA: Diagnosis not present

## 2016-10-23 DIAGNOSIS — E441 Mild protein-calorie malnutrition: Secondary | ICD-10-CM | POA: Diagnosis not present

## 2016-10-23 DIAGNOSIS — F0280 Dementia in other diseases classified elsewhere without behavioral disturbance: Secondary | ICD-10-CM

## 2016-10-23 NOTE — Assessment & Plan Note (Signed)
Certainly not MDD On mirtazapine now Discussed potential problems with meds---will hold off for now Did have swallow study which was abnormal--but not adjusting food or liquids at this point

## 2016-10-23 NOTE — Progress Notes (Signed)
Pre visit review using our clinic review tool, if applicable. No additional management support is needed unless otherwise documented below in the visit note. 

## 2016-10-23 NOTE — Progress Notes (Signed)
Subjective:    Patient ID: Corey Clark, male    DOB: 1925/07/25, 83 y.o.   MRN: [id number]  HPI Here with wife for several concerns  He doesn't have much to say Wife is concerned about depression He notes a problem with appetite ---has lost some weight He doesn't "think I 'm depressed all the time" He enjoys just sitting and watching TV  Wife doesn't note any cognitive improvement with the donepezil [place] twice a week--does okay with the stimulation. He is now agreeable to going (doesn't fight it anymore) He does like this now--- "I would miss it if I didn't go"  Has the boost with ice cream--but only 1/2 can a day  Wife is trying to cook He doesn't like a lot now though  Current Outpatient Prescriptions on File Prior to Visit  Medication Sig Dispense Refill  . donepezil (ARICEPT) 5 MG tablet Take 1 tablet (5 mg total) by mouth at bedtime. 30 tablet 11  . ipratropium (ATROVENT) 0.03 % nasal spray Place 2 sprays into both nostrils 3 (three) times daily.    . Lactulose 20 GM/30ML SOLN Take 30 mLs (20 g total) by mouth daily as needed. 60 mL 0  . memantine (NAMENDA) 5 MG tablet Take 2 tablets by mouth daily.    . mirtazapine (REMERON) 15 MG tablet Take by mouth.    . Multiple Vitamin (MULTIVITAMIN) tablet Take 1 tablet by mouth daily.    . sucralfate (CARAFATE) 1 GM/10ML suspension Take 1 g by mouth daily.      No current facility-administered medications on file prior to visit.     Allergies  Allergen Reactions  . Tetracyclines & Related Other (See Comments)    Patient not sure it's been years since he's taken med    Past Medical History:  Diagnosis Date  . Alzheimer's dementia   . BPH (benign prostatic hypertrophy)   . Colon polyps   . GERD (gastroesophageal reflux disease)   . Neuropathy due to drug [facility])    chemo for stomach cancer  . Osteoarthrosis involving, or with mention of more than one site, but not specified as generalized, multiple sites   . SCC  (squamous cell carcinoma), face 2014   left preauricular---[facility]  . Stomach cancer ([place]) 1999   MALT lymphoma --surgery then chemo. Bad infection after with 1 month hospitalization    Past Surgical History:  Procedure Laterality Date  . CATARACT EXTRACTION, BILATERAL  2002, 2003  . [place]  . PARTIAL GASTRECTOMY  1999   2 procedures for stomach cancer  . ROTATOR CUFF REPAIR  1991/1998   left and right done  . TENDON RELEASE Left 6/15   Dr Jefm Bryant    Family History  Problem Relation Age of Onset  . Cancer Brother   . Heart disease Neg Hx   . Hypertension Neg Hx   . Diabetes Neg Hx     Social History   Social History  . Marital status: Married    Spouse name: N/A  . Number of children: 3  . Years of education: N/A   Occupational History  . 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 Smoking status: Former Smoker    Quit date: 10/15/1950  . Smokeless tobacco: Never Used  . Alcohol use Yes     Comment: rare wine  . Drug use: No  . Sexual activity: No   Other Topics  Concern  . Not on file   Social History Narrative   3 children in [place]      Has living will    Wife, then son, [place] Mishra., to be health care POA   Would accept resuscitation attempts   Not sure about tube feeds-- probably doesn't want if cognitively unaware   Review of Systems Sleeps well Does feel sleepy in the morning as well--and naps frequently Done with the physical therapy    Objective:   Physical Exam  Constitutional: He appears well-developed. No distress.  Neurological:  Appropriate engagement--conversation is fairly fluent  Psychiatric:  Fairly conversant Not clearly depressed          Assessment & Plan:

## 2016-10-23 NOTE — Assessment & Plan Note (Signed)
Will continue the mirtazapine for now Try off donepezil

## 2016-10-23 NOTE — Assessment & Plan Note (Signed)
Still mild I am concerned about side effects from the meds Will try off the donepezil (to see about appetite and cognition) Continue namenda for now--may want to reconsider

## 2016-10-23 NOTE — Patient Instructions (Signed)
Please try off the donepezil for now--to see if it helps your appetite. Make sure you are having 1/2 of boost with ice cream, at least twice a day. Order your main meal daily from the [place] for now. Please try the miralax powder--but only use 1/2 capful daily.

## 2016-10-30 ENCOUNTER — Encounter: Payer: Self-pay | Admitting: Speech Pathology

## 2016-10-30 ENCOUNTER — Ambulatory Visit: Payer: Medicare Other | Attending: Otolaryngology | Admitting: Speech Pathology

## 2016-10-30 DIAGNOSIS — R490 Dysphonia: Secondary | ICD-10-CM

## 2016-10-30 DIAGNOSIS — R1313 Dysphagia, pharyngeal phase: Secondary | ICD-10-CM | POA: Insufficient documentation

## 2016-10-30 NOTE — Therapy (Signed)
[place] MAIN [facility] SERVICES [address] [place], [place], [address] Phone: [phone]   Fax:  [phone]  Speech Language Pathology Evaluation  Patient Details  Name: Corey Clark MRN: [id number] Date of Birth: December 06, 1924 Referring Provider: Dr. Pryor Ochoa  Encounter Date: 10/30/2016      End of Session - 10/30/16 1344    Visit Number 1   Number of Visits 17   Date for SLP Re-Evaluation 12/28/16   SLP Start Time 1100   SLP Stop Time  1150   SLP Time Calculation (min) 50 min      Past Medical History:  Diagnosis Date  . Alzheimer's dementia   . BPH (benign prostatic hypertrophy)   . Colon polyps   . GERD (gastroesophageal reflux disease)   . Neuropathy due to drug [facility])    chemo for stomach cancer  . Osteoarthrosis involving, or with mention of more than one site, but not specified as generalized, multiple sites   . SCC (squamous cell carcinoma), face 2014   left preauricular---[facility]  . Stomach cancer ([place]) 1999   MALT lymphoma --surgery then chemo. Bad infection after with 1 month hospitalization    Past Surgical History:  Procedure Laterality Date  . CATARACT EXTRACTION, BILATERAL  2002, 2003  . [place]  . PARTIAL GASTRECTOMY  1999   2 procedures for stomach cancer  . ROTATOR CUFF REPAIR  1991/1998   left and right done  . TENDON RELEASE Left 6/15   Dr Jefm Bryant    There were no vitals filed for this visit.          SLP Evaluation [facility] - 10/30/16 0001      SLP Visit Information   SLP Received On 10/30/16   Referring Provider Dr. Pryor Ochoa   Onset Date 10/04/2016   Medical Diagnosis Dysphonia/Pharyngeal Dysphagia     Subjective   Subjective Patient is eager to improve voice and swallowing   Patient/Family Stated Goal Wife: "I just want to be able to hear him!"     General Information   HPI : 83 year old man; with recent diagnosis of Alzheimer's, vocal quality change,  unintentional weight loss, and recent MBS positive for oropharyngeal dysphagia     Prior Functional Status   Cognitive/Linguistic Baseline Within functional limits     Cognition   Overall Cognitive Status Within Functional Limits for tasks assessed     Auditory Comprehension   Overall Auditory Comprehension Appears within functional limits for tasks assessed     Reading Comprehension   Reading Status Within funtional limits     Verbal Expression   Overall Verbal Expression Appears within functional limits for tasks assessed     Oral Motor/Sensory Function   Overall Oral Motor/Sensory Function Appears within functional limits for tasks assessed     Motor Speech   Overall Motor Speech Impaired   Respiration Impaired   Level of Impairment Conversation   Phonation Breathy;Hoarse;Low vocal intensity   Resonance Within functional limits   Articulation Within functional limitis   Intelligibility Intelligible   Phonation Impaired   Vocal Abuses Habitual Cough/Throat Clear   Tension Present Jaw;Neck;Shoulder   Volume Soft   Pitch Appropriate     Standardized Assessments   Standardized Assessments  Other Assessment  MBSS/ December 2017      MBSS 10/04/2016: moderate pharyngeal dysphagia characterized by decreased tongue base retraction, decreased hyolaryngeal movement (elevation and anterior movement), incomplete epiglottic inversion, and moderate vallecular residue of  solids (mild with liquids).  There was no observed laryngeal penetration/aspiration of solids.  There is laryngeal penetration without aspiration of nectar-thick liquid.  There is laryngeal penetration with trace aspiration of laryngeal vestibule residue with thin liquids.  A chin down posture with throat clear and second swallow prevents aspiration.  The patient was able to effectively masticate a hard cracker within a moist substrate.  Recommend continue with regular diet using strategies (chin down, throat clear, dry  swallow) for liquids.  The results and recommendations were discussed with the patient and his wife and they were provided with written results and recommendations at the time of the study.  The patient would benefit from swallowing exercises to improve hyolaryngeal movement and pharyngeal clearance.  These exercise may improve vocal quality as well.  The patient and his wife expressed interest in pursuing speech therapy for swallowing and voice.   EDUCATION: Reviewed results and recommendations of the MBS performed 10/04/2016.  Patient given written voice and swallowing exercises and a log for recording performance.  The patient and his wife state and demonstrate good understanding of the exercises, standards for performing, frequency, and rationale.   VOICE AND SWALLOWING EXERCISES  Modified Shaker Exercise (Improves throat muscle strength to maximize swallow pressures) . Make a fist and place under your chin . Open your mouth to a wide yawn position . Use your fist as resistance to push up against jaw opening/lowering . Do 10 slow repetitions Tongue Resistance Exercise (Increases tongue base retraction to maximize swallow pressures) . Stick out your tongue as far as you can . Put something flat (back of spoon or tongue blade) against your tongue tip . Push your tongue against the flat surface as you push the flat surface against your tongue . Hold for 1-2 seconds   [employer] . Press your tongue flat against the roof of your mouth and hold it there while you swallow hard, squeezing the muscles in your face and neck tightly . Repeat 10 times Masako Swallow (Increases pharyngeal muscle strength to maximize swallow pressures) No food or liquid with this exercise! . Hold the tip of your tongue between your lips or your teeth . Swallow . Repeat 10 times . The further you can hold your tongue the more you are working your muscles! Loud "Ah" (Improves lingual motility, timing of swallow,  oropharyngeal transit time and improves vocal strength and quality) . Say "Ah" as loud and long as you can . Repeat 15 times . Be sure you are not straining in your throat- dig deep and be LOUD Effortful Pitch Glide  (Falsetto) (Increases laryngeal elevation and improves vocal strength) . Say "EEEEEE" starting as low as you can and glide up to as high as you can . Hold the high note for 10-20 seconds . Feel the muscles in your throat tighten and your voice box move up . Repeat 10 times Talk or Read LOUD for 5-10 minutes (Improves vocal strength and quality)     SLP Long Term Goals - 10/30/16 1348      SLP LONG TERM GOAL #1   Title Patient will independently perform selected voice and swallowing exercises given written instructions.   Time 8   Period Weeks   Status New     SLP LONG TERM GOAL #2   Title The patient will complete Hierarchal Speech Loudness reading drills (words/phrases, sentences, and paragraph) at average 75 dB and with loud, good quality voice.     Time 8  Period Weeks   Status New          Plan - 10/30/16 1345    Clinical Impression Statement : 83 year old man; with recent diagnosis of Alzheimer's, vocal quality change, unintentional weight loss, and recent MBS positive for oropharyngeal dysphagia; is presenting with moderate dysphonia characterized primarily by hypophonia as well as hoarse vocal quality.  The patient will benefit from voice and swallowing therapy to improve voice strength / quality and swallowing function.  The patient demonstrates good ability to perform the exercises and has strong family support to insure diligence in exercising.     Speech Therapy Frequency 2x / week   Duration Other (comment)  8 weeks   Treatment/Interventions Pharyngeal strengthening exercises;Other (comment)  Voice therapy   Potential to Achieve Goals Good   Potential Considerations Ability to learn/carryover information;Co-morbidities;Cooperation/participation  level;Previous level of function;Severity of impairments;Family/community support   SLP Home Exercise Plan Patient given written swallowing and voice exercises with log for recording performance   Consulted and Agree with Plan of Care Patient;Family member/caregiver   Family Member Consulted Spouse      Patient will benefit from skilled therapeutic intervention in order to improve the following deficits and impairments:   Dysphonia - Plan: SLP plan of care cert/re-cert  Dysphagia, pharyngeal phase - Plan: SLP plan of care cert/re-cert      G-Codes - [id number]    Functional Assessment Tool Used MBSS, perceptual voice evaluation, clinical judgment   Functional Limitations Voice   Swallow Current Status [id number]) --   Swallow Goal Status [id number]) --   Voice Current Status [id number]) At least 40 percent but less than 60 percent impaired, limited or restricted   Voice Goal Status [id number]) At least 20 percent but less than 40 percent impaired, limited or restricted      Problem List Patient Active Problem List   Diagnosis Date Noted  . Mood disorder ([place]) 10/23/2016  . Mild malnutrition ([place]) 07/27/2016  . Alzheimer's dementia, late onset 08/31/2015  . 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 Cough 07/25/2015  . Advance directive discussed with patient 01/17/2015  . Non-allergic rhinitis 06/29/2014  . Abnormality of lung on CXR 06/29/2014  . Trigger finger 04/07/2014  . [facility] DJD(carpometacarpal degenerative joint disease), localized primary 07/13/2013  . H/O malignant neoplasm of skin 06/29/2013  . BPH (benign prostatic hypertrophy)   . Routine general medical examination at a health care facility 05/30/2012  . Other constipation 11/26/2011  . GERD (gastroesophageal reflux disease)   . Osteoarthritis, multiple sites   . Neuropathy due to drug [facility])     Valetta Fuller, Susie 10/30/2016, 4:13 PM  [place] MAIN [facility] SERVICES [place],  [place], [address] Phone: [phone]   Fax:  [phone]  Name: JAYDENN MALINA MRN: [id number] Date of Birth: 01-17-1925

## 2016-11-01 ENCOUNTER — Ambulatory Visit: Payer: Medicare Other | Admitting: Speech Pathology

## 2016-11-06 ENCOUNTER — Ambulatory Visit (INDEPENDENT_AMBULATORY_CARE_PROVIDER_SITE_OTHER): Payer: Medicare Other | Admitting: Internal Medicine

## 2016-11-06 ENCOUNTER — Encounter: Payer: Self-pay | Admitting: Internal Medicine

## 2016-11-06 DIAGNOSIS — G620 Drug-induced polyneuropathy: Secondary | ICD-10-CM | POA: Diagnosis not present

## 2016-11-06 DIAGNOSIS — F39 Unspecified mood [affective] disorder: Secondary | ICD-10-CM

## 2016-11-06 DIAGNOSIS — E441 Mild protein-calorie malnutrition: Secondary | ICD-10-CM | POA: Diagnosis not present

## 2016-11-06 DIAGNOSIS — F0280 Dementia in other diseases classified elsewhere without behavioral disturbance: Secondary | ICD-10-CM

## 2016-11-06 DIAGNOSIS — G301 Alzheimer's disease with late onset: Secondary | ICD-10-CM

## 2016-11-06 NOTE — Assessment & Plan Note (Signed)
Eating better but still lost weight Discussed the supplement Will try decreasing the mirtazapine due to the sedation

## 2016-11-06 NOTE — Patient Instructions (Addendum)
Please cut the mirtazapine in half and only give him 7.5mg  at bedtime. Also, let's try off the memantine for now. Please try the miralax daily at bedtime or in the evening sometime.

## 2016-11-06 NOTE — Assessment & Plan Note (Signed)
Still mild No worse off the donepezil Will try off the memantine as well

## 2016-11-06 NOTE — Assessment & Plan Note (Signed)
Persists but not severe No Rx needed

## 2016-11-06 NOTE — Progress Notes (Signed)
Subjective:    Patient ID: Corey Clark, male    DOB: 01/04/1925, 83 y.o.   MRN: [id number]  HPI Her e for follow up of dementia and mood issues  Did go to the speech specialist once Gave him exercises--they are difficult and time consuming (and noisy) Some trouble with swallowing tiny pills--needs to take extra water Still not much appetite Wife still cooking--what they had They are getting food from the [employer]--but still not a good appetite (but will eat) They are trying ensure at times  Off the donepezil--- no clear cognitive difference Sleeps a lot--even in the day  Current Outpatient Prescriptions on File Prior to Visit  Medication Sig Dispense Refill  . ipratropium (ATROVENT) 0.03 % nasal spray Place 2 sprays into both nostrils 3 (three) times daily.    . Lactulose 20 GM/30ML SOLN Take 30 mLs (20 g total) by mouth daily as needed. 60 mL 0  . memantine (NAMENDA) 5 MG tablet Take 2 tablets by mouth daily.    . mirtazapine (REMERON) 15 MG tablet Take by mouth.    . Multiple Vitamin (MULTIVITAMIN) tablet Take 1 tablet by mouth daily.    . sucralfate (CARAFATE) 1 GM/10ML suspension Take 1 g by mouth daily.      No current facility-administered medications on file prior to visit.     Allergies  Allergen Reactions  . Tetracyclines & Related Other (See Comments)    Patient not sure it's been years since he's taken med    Past Medical History:  Diagnosis Date  . Alzheimer's dementia   . BPH (benign prostatic hypertrophy)   . Colon polyps   . GERD (gastroesophageal reflux disease)   . Neuropathy due to drug [facility])    chemo for stomach cancer  . Osteoarthrosis involving, or with mention of more than one site, but not specified as generalized, multiple sites   . SCC (squamous cell carcinoma), face 2014   left preauricular---[facility]  . Stomach cancer ([place]) 1999   MALT lymphoma --surgery then chemo. Bad infection after with 1 month hospitalization    Past  Surgical History:  Procedure Laterality Date  . CATARACT EXTRACTION, BILATERAL  2002, 2003  . [place]  . PARTIAL GASTRECTOMY  1999   2 procedures for stomach cancer  . ROTATOR CUFF REPAIR  1991/1998   left and right done  . TENDON RELEASE Left 6/15   Dr Jefm Bryant    Family History  Problem Relation Age of Onset  . Cancer Brother   . Heart disease Neg Hx   . Hypertension Neg Hx   . Diabetes Neg Hx     Social History   Social History  . Marital status: Married    Spouse name: N/A  . Number of children: 3  . Years of education: N/A   Occupational History  . 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 Smoking status: Former Smoker    Quit date: 10/15/1950  . Smokeless tobacco: Never Used  . Alcohol use Yes     Comment: rare wine  . Drug use: No  . Sexual activity: No   Other Topics Concern  . Not on file   Social History Narrative   3 children in [place]      Has living will    Wife, then son, Corey Clark., to be health care POA   Would accept resuscitation attempts  Not sure about tube feeds-- probably doesn't want if cognitively unaware   Review of Systems He notices some trouble with his hearing--- and nose will stop up also Wears his hearing aides Constipation is ongoing--afraid to take the med for fear it "will hit him" Still with numbness in toes--from Rx for stomach cancer    Objective:   Physical Exam  Constitutional: No distress.  Psychiatric:  Tries to engage but trouble keeping up with the conversation          Assessment & Plan:

## 2016-11-06 NOTE — Assessment & Plan Note (Signed)
Some dysthymia related to dementia Not sure Rx worth the risk

## 2016-11-06 NOTE — Progress Notes (Signed)
Pre visit review using our clinic review tool, if applicable. No additional management support is needed unless otherwise documented below in the visit note. 

## 2016-11-08 ENCOUNTER — Ambulatory Visit: Payer: Medicare Other | Attending: Otolaryngology | Admitting: Speech Pathology

## 2016-11-08 ENCOUNTER — Ambulatory Visit: Payer: Medicare Other | Admitting: Podiatry

## 2016-11-08 ENCOUNTER — Encounter: Payer: Self-pay | Admitting: Speech Pathology

## 2016-11-08 DIAGNOSIS — R1313 Dysphagia, pharyngeal phase: Secondary | ICD-10-CM | POA: Insufficient documentation

## 2016-11-08 DIAGNOSIS — R490 Dysphonia: Secondary | ICD-10-CM

## 2016-11-08 NOTE — Therapy (Signed)
[place] MAIN [facility] SERVICES [address] [place], [place], [address] Phone: [phone]   Fax:  [phone]  Speech Language Pathology Treatment  Patient Details  Name: Corey Clark MRN: [id number] Date of Birth: [birth year] Referring Provider: Dr. Pryor Ochoa  Encounter Date: 11/08/2016      End of Session - 11/08/16 1249    Visit Number 2   Number of Visits 17   Date for SLP Re-Evaluation 12/28/16   SLP Start Time 39   SLP Stop Time  1200   SLP Time Calculation (min) 60 min   Activity Tolerance Patient tolerated treatment well      Past Medical History:  Diagnosis Date  . Alzheimer's dementia   . BPH (benign prostatic hypertrophy)   . Colon polyps   . GERD (gastroesophageal reflux disease)   . Neuropathy due to drug [facility])    chemo for stomach cancer  . Osteoarthrosis involving, or with mention of more than one site, but not specified as generalized, multiple sites   . SCC (squamous cell carcinoma), face 2014   left preauricular---[facility]  . Stomach cancer ([place]) 1999   MALT lymphoma --surgery then chemo. Bad infection after with 1 month hospitalization    Past Surgical History:  Procedure Laterality Date  . CATARACT EXTRACTION, BILATERAL  2002, 2003  . [place]  . PARTIAL GASTRECTOMY  1999   2 procedures for stomach cancer  . ROTATOR CUFF REPAIR  1991/1998   left and right done  . TENDON RELEASE Left 6/15   Dr Jefm Bryant    There were no vitals filed for this visit.      Subjective Assessment - 11/08/16 1247    Subjective The patient reports that when he speaks loudly his ears feel stuffed up.  He is counseled to direct questions to his ENT. In the meantime, this is not harmful and he can procede with his exercises   Patient is accompained by: Family member               ADULT SLP TREATMENT - 11/08/16 0001      General Information   Behavior/Cognition Alert;Cooperative;Pleasant mood      Treatment Provided   Treatment provided Cognitive-Linquistic;Dysphagia     Pain Assessment   Pain Assessment No/denies pain     Cognitive-Linquistic Treatment   Treatment focused on Voice  Swallowing   Skilled Treatment VOICE and SWALLOWING EXERCISES:  Patient able to complete 5/6 exercises accurately.  One exercise was not easily understood by the patient.  However, he was doing the Engineer, petroleum (which addresses similar muscles) so this exercise was changed. VOCAL LOUDNESS: Patient able to generate automatic series and short phrases with loud, good quality voice.     Assessment / Recommendations / Plan   Plan Continue with current plan of care     Progression Toward Goals   Progression toward goals Progressing toward goals          SLP Education - 11/08/16 1249    Education provided Yes   Education Details HEP   Person(s) Educated Patient;Spouse   Methods Explanation   Comprehension Verbalized understanding            SLP Long Term Goals - 10/30/16 1348      SLP LONG TERM GOAL #1   Title Patient will independently perform selected voice and swallowing exercises given written instructions.   Time 8   Period Weeks   Status New  SLP LONG TERM GOAL #2   Title The patient will complete Hierarchal Speech Loudness reading drills (words/phrases, sentences, and paragraph) at average 75 dB and with loud, good quality voice.     Time 8   Period Weeks   Status New          Plan - 11/08/16 1249    Clinical Impression Statement The patient is able to perform the voice and swallowing exercises (making one substitution) and is able to generate a loud, good quality voice.   Speech Therapy Frequency 2x / week   Duration Other (comment)   Treatment/Interventions Pharyngeal strengthening exercises;Other (comment)  Voice therapy   Potential to Achieve Goals Good   Potential Considerations Ability to learn/carryover  information;Co-morbidities;Cooperation/participation level;Previous level of function;Severity of impairments;Family/community support   SLP Home Exercise Plan Patient given written swallowing and voice exercises with log for recording performance   Consulted and Agree with Plan of Care Patient;Family member/caregiver   Family Member Consulted Spouse      Patient will benefit from skilled therapeutic intervention in order to improve the following deficits and impairments:   Dysphonia  Dysphagia, pharyngeal phase    Problem List Patient Active Problem List   Diagnosis Date Noted  . Mood disorder ([place]) 10/23/2016  . Mild malnutrition ([place]) 07/27/2016  . Alzheimer's dementia, late onset 08/31/2015  . 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 Cough 07/25/2015  . Advance directive discussed with patient 01/17/2015  . Non-allergic rhinitis 06/29/2014  . Abnormality of lung on CXR 06/29/2014  . Trigger finger 04/07/2014  . [facility] DJD(carpometacarpal degenerative joint disease), localized primary 07/13/2013  . H/O malignant neoplasm of skin 06/29/2013  . BPH (benign prostatic hypertrophy)   . Routine general medical examination at a health care facility 05/30/2012  . Other constipation 11/26/2011  . GERD (gastroesophageal reflux disease)   . Osteoarthritis, multiple sites   . Neuropathy due to drug [facility])    [name], MS/CCC- SLP  [name] 11/08/2016, 12:50 PM  [place] MAIN [facility] SERVICES [address] [place], [place], [address] Phone: [phone]   Fax:  [phone]   Name: Corey Clark MRN: [id number] Date of Birth: Feb 14, 1925

## 2016-11-13 ENCOUNTER — Ambulatory Visit: Payer: Medicare Other | Admitting: Speech Pathology

## 2016-11-13 ENCOUNTER — Encounter: Payer: Self-pay | Admitting: Speech Pathology

## 2016-11-13 DIAGNOSIS — R490 Dysphonia: Secondary | ICD-10-CM

## 2016-11-13 DIAGNOSIS — R1313 Dysphagia, pharyngeal phase: Secondary | ICD-10-CM

## 2016-11-13 NOTE — Therapy (Signed)
[place] MAIN [facility] SERVICES [address] [place], [place], [address] Phone: [phone]   Fax:  [phone]  Speech Language Pathology Treatment  Patient Details  Name: Corey Clark MRN: [id number] Date of Birth: [birth year] Referring Provider: Dr. Pryor Ochoa  Encounter Date: 11/13/2016      End of Session - 11/13/16 1547    Visit Number 3   Number of Visits 17   Date for SLP Re-Evaluation 12/28/16   SLP Start Time [id number]   SLP Stop Time  1500   SLP Time Calculation (min) 55 min   Activity Tolerance Patient tolerated treatment well      Past Medical History:  Diagnosis Date  . Alzheimer's dementia   . BPH (benign prostatic hypertrophy)   . Colon polyps   . GERD (gastroesophageal reflux disease)   . Neuropathy due to drug [facility])    chemo for stomach cancer  . Osteoarthrosis involving, or with mention of more than one site, but not specified as generalized, multiple sites   . SCC (squamous cell carcinoma), face 2014   left preauricular---[facility]  . Stomach cancer ([place]) 1999   MALT lymphoma --surgery then chemo. Bad infection after with 1 month hospitalization    Past Surgical History:  Procedure Laterality Date  . CATARACT EXTRACTION, BILATERAL  2002, 2003  . [place]  . PARTIAL GASTRECTOMY  1999   2 procedures for stomach cancer  . ROTATOR CUFF REPAIR  1991/1998   left and right done  . TENDON RELEASE Left 6/15   Dr Jefm Bryant    There were no vitals filed for this visit.      Subjective Assessment - 11/13/16 1547    Subjective The patient reports that when he speaks loudly his ears feel stuffed up.  He is counseled to direct questions to his ENT. In the meantime, this is not harmful and he can procede with his exercises   Patient is accompained by: Family member   Currently in Pain? No/denies               ADULT SLP TREATMENT - 11/13/16 0001      General Information   Behavior/Cognition  Alert;Cooperative;Pleasant mood     Treatment Provided   Treatment provided Cognitive-Linquistic;Dysphagia     Pain Assessment   Pain Assessment No/denies pain     Cognitive-Linquistic Treatment   Treatment focused on Voice;Other (comment)  Swallowing   Skilled Treatment VOICE and SWALLOWING EXERCISES:  Patient able to complete 6/6 exercises accurately.  The patient's wife is accurate in her cuing/assistance. VOCAL LOUDNESS: Patient able to sentences with loud, good quality voice.     Assessment / Recommendations / Plan   Plan Continue with current plan of care     Progression Toward Goals   Progression toward goals Progressing toward goals          SLP Education - 11/13/16 1547    Education provided Yes   Education Details HEP, vocal loudness   Person(s) Educated Patient;Spouse   Methods Explanation   Comprehension Verbalized understanding            SLP Long Term Goals - 10/30/16 1348      SLP LONG TERM GOAL #1   Title Patient will independently perform selected voice and swallowing exercises given written instructions.   Time 8   Period Weeks   Status New     SLP LONG TERM GOAL #2   Title The patient will complete  Hierarchal Speech Loudness reading drills (words/phrases, sentences, and paragraph) at average 75 dB and with loud, good quality voice.     Time 8   Period Weeks   Status New          Plan - 11/13/16 1548    Clinical Impression Statement The patient is able to perform the voice and swallowing exercises and is able to generate a loud, good quality voice.  He is adjusting to the level of effort required to generate a loud, good quality voice.   Speech Therapy Frequency 2x / week   Duration Other (comment)   Treatment/Interventions Pharyngeal strengthening exercises;Other (comment)  Voice therapy   Potential to Achieve Goals Good   Potential Considerations Ability to learn/carryover information;Co-morbidities;Cooperation/participation  level;Previous level of function;Severity of impairments;Family/community support   SLP Home Exercise Plan Patient given written swallowing and voice exercises with log for recording performance   Consulted and Agree with Plan of Care Patient;Family member/caregiver   Family Member Consulted Spouse      Patient will benefit from skilled therapeutic intervention in order to improve the following deficits and impairments:   Dysphonia  Dysphagia, pharyngeal phase    Problem List Patient Active Problem List   Diagnosis Date Noted  . Mood disorder ([place]) 10/23/2016  . Mild malnutrition ([place]) 07/27/2016  . Alzheimer's dementia, late onset 08/31/2015  . 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 Cough 07/25/2015  . Advance directive discussed with patient 01/17/2015  . Non-allergic rhinitis 06/29/2014  . Abnormality of lung on CXR 06/29/2014  . Trigger finger 04/07/2014  . [facility] DJD(carpometacarpal degenerative joint disease), localized primary 07/13/2013  . H/O malignant neoplasm of skin 06/29/2013  . BPH (benign prostatic hypertrophy)   . Routine general medical examination at a health care facility 05/30/2012  . Other constipation 11/26/2011  . GERD (gastroesophageal reflux disease)   . Osteoarthritis, multiple sites   . Neuropathy due to drug [facility])    [name], MS/CCC- SLP  [name] 11/13/2016, 3:48 PM  [place] MAIN [facility] SERVICES [address] [place], [place], [address] Phone: [phone]   Fax:  [phone]   Name: Corey Clark MRN: [id number] Date of Birth: 06-25-1925

## 2016-11-15 ENCOUNTER — Ambulatory Visit: Payer: Medicare Other | Attending: Otolaryngology | Admitting: Speech Pathology

## 2016-11-15 ENCOUNTER — Encounter: Payer: Self-pay | Admitting: Speech Pathology

## 2016-11-15 DIAGNOSIS — R490 Dysphonia: Secondary | ICD-10-CM | POA: Diagnosis present

## 2016-11-15 DIAGNOSIS — R1313 Dysphagia, pharyngeal phase: Secondary | ICD-10-CM | POA: Diagnosis present

## 2016-11-15 NOTE — Therapy (Signed)
[place] MAIN [facility] SERVICES [address] [place], [place], [address] Phone: [phone]   Fax:  [phone]  Speech Language Pathology Treatment  Patient Details  Name: Corey Clark MRN: [id number] Date of Birth: 03-20-1925 Referring Provider: Dr. Pryor Ochoa  Encounter Date: 11/15/2016      End of Session - 11/15/16 1401    Visit Number 4   Number of Visits 17   Date for SLP Re-Evaluation 12/28/16   SLP Start Time 1300   SLP Stop Time  1351   SLP Time Calculation (min) 51 min   Activity Tolerance Patient tolerated treatment well      Past Medical History:  Diagnosis Date  . Alzheimer's dementia   . BPH (benign prostatic hypertrophy)   . Colon polyps   . GERD (gastroesophageal reflux disease)   . Neuropathy due to drug [facility])    chemo for stomach cancer  . Osteoarthrosis involving, or with mention of more than one site, but not specified as generalized, multiple sites   . SCC (squamous cell carcinoma), face 2014   left preauricular---[facility]  . Stomach cancer ([place]) 1999   MALT lymphoma --surgery then chemo. Bad infection after with 1 month hospitalization    Past Surgical History:  Procedure Laterality Date  . CATARACT EXTRACTION, BILATERAL  2002, 2003  . [place]  . PARTIAL GASTRECTOMY  1999   2 procedures for stomach cancer  . ROTATOR CUFF REPAIR  1991/1998   left and right done  . TENDON RELEASE Left 6/15   Dr Jefm Bryant    There were no vitals filed for this visit.      Subjective Assessment - 11/15/16 1400    Subjective The patient reports that when he speaks loudly his ears feel stuffed up.  He is counseled to direct questions to his ENT. In the meantime, this is not harmful and he can procede with his exercises   Patient is accompained by: Family member   Currently in Pain? No/denies               ADULT SLP TREATMENT - 11/15/16 0001      General Information   Behavior/Cognition  Alert;Cooperative;Pleasant mood     Treatment Provided   Treatment provided Cognitive-Linquistic     Pain Assessment   Pain Assessment No/denies pain     Cognitive-Linquistic Treatment   Treatment focused on Voice;Other (comment)  Swallowing   Skilled Treatment VOICE and SWALLOWING EXERCISES:  Patient able to complete 6/6 exercises accurately.  VOCAL LOUDNESS: Patient maintain loud, good quality voice while completing simple cognitive linguistic tasks with 75% accuracy and min cues (reminders to be loud).     Assessment / Recommendations / Plan   Plan Continue with current plan of care     Progression Toward Goals   Progression toward goals Progressing toward goals          SLP Education - 11/15/16 1401    Education provided Yes   Education Details HEP, vocal loudness   Person(s) Educated Patient;Spouse   Methods Explanation   Comprehension Verbalized understanding            SLP Long Term Goals - 10/30/16 1348      SLP LONG TERM GOAL #1   Title Patient will independently perform selected voice and swallowing exercises given written instructions.   Time 8   Period Weeks   Status New     SLP LONG TERM GOAL #2  Title The patient will complete Hierarchal Speech Loudness reading drills (words/phrases, sentences, and paragraph) at average 75 dB and with loud, good quality voice.     Time 8   Period Weeks   Status New          Plan - 11/15/16 1401    Clinical Impression Statement The patient is able to perform the voice and swallowing exercises and is able to generate a loud, good quality voice.  He is adjusting to the level of effort required to generate a loud, good quality voice.   Speech Therapy Frequency 2x / week   Duration Other (comment)   Treatment/Interventions Pharyngeal strengthening exercises;Other (comment)  Voice therapy   Potential to Achieve Goals Good   Potential Considerations Ability to learn/carryover  information;Co-morbidities;Cooperation/participation level;Previous level of function;Severity of impairments;Family/community support   SLP Home Exercise Plan Patient given written swallowing and voice exercises with log for recording performance   Consulted and Agree with Plan of Care Patient;Family member/caregiver   Family Member Consulted Spouse      Patient will benefit from skilled therapeutic intervention in order to improve the following deficits and impairments:   Dysphonia  Dysphagia, pharyngeal phase    Problem List Patient Active Problem List   Diagnosis Date Noted  . Mood disorder ([place]) 10/23/2016  . Mild malnutrition ([place]) 07/27/2016  . Alzheimer's dementia, late onset 08/31/2015  . 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 Cough 07/25/2015  . Advance directive discussed with patient 01/17/2015  . Non-allergic rhinitis 06/29/2014  . Abnormality of lung on CXR 06/29/2014  . Trigger finger 04/07/2014  . [facility] DJD(carpometacarpal degenerative joint disease), localized primary 07/13/2013  . H/O malignant neoplasm of skin 06/29/2013  . BPH (benign prostatic hypertrophy)   . Routine general medical examination at a health care facility 05/30/2012  . Other constipation 11/26/2011  . GERD (gastroesophageal reflux disease)   . Osteoarthritis, multiple sites   . Neuropathy due to drug [facility])    [name], MS/CCC- SLP  [name] 11/15/2016, 2:02 PM  [place] MAIN [facility] SERVICES [address] [place], [place], [address] Phone: [phone]   Fax:  [phone]   Name: Corey Clark MRN: [id number] Date of Birth: 04-19-1925

## 2016-11-19 ENCOUNTER — Ambulatory Visit: Payer: Medicare Other | Admitting: Speech Pathology

## 2016-11-26 ENCOUNTER — Ambulatory Visit: Payer: Medicare Other | Admitting: Podiatry

## 2016-11-27 ENCOUNTER — Ambulatory Visit: Payer: Medicare Other | Admitting: Speech Pathology

## 2016-11-27 ENCOUNTER — Encounter: Payer: Self-pay | Admitting: Speech Pathology

## 2016-11-27 DIAGNOSIS — R490 Dysphonia: Secondary | ICD-10-CM | POA: Diagnosis not present

## 2016-11-27 DIAGNOSIS — R1313 Dysphagia, pharyngeal phase: Secondary | ICD-10-CM

## 2016-11-27 NOTE — Therapy (Signed)
[place] MAIN [facility] SERVICES [address] [place], [place], [address] Phone: [phone]   Fax:  [phone]  Speech Language Pathology Treatment  Patient Details  Name: Corey Clark MRN: [id number] Date of Birth: [birth year] Referring Provider: Dr. Pryor Ochoa  Encounter Date: 11/27/2016      End of Session - 11/27/16 1443    Visit Number 5   Number of Visits 17   Date for SLP Re-Evaluation 12/28/16   SLP Start Time 1330   SLP Stop Time  1430   SLP Time Calculation (min) 60 min   Activity Tolerance Patient tolerated treatment well      Past Medical History:  Diagnosis Date  . Alzheimer's dementia   . BPH (benign prostatic hypertrophy)   . Colon polyps   . GERD (gastroesophageal reflux disease)   . Neuropathy due to drug [facility])    chemo for stomach cancer  . Osteoarthrosis involving, or with mention of more than one site, but not specified as generalized, multiple sites   . SCC (squamous cell carcinoma), face 2014   left preauricular---[facility]  . Stomach cancer ([place]) 1999   MALT lymphoma --surgery then chemo. Bad infection after with 1 month hospitalization    Past Surgical History:  Procedure Laterality Date  . CATARACT EXTRACTION, BILATERAL  2002, 2003  . [place]  . PARTIAL GASTRECTOMY  1999   2 procedures for stomach cancer  . ROTATOR CUFF REPAIR  1991/1998   left and right done  . TENDON RELEASE Left 6/15   Dr Jefm Bryant    There were no vitals filed for this visit.      Subjective Assessment - 11/27/16 1443    Subjective The patient reports that when he speaks loudly his ears feel stuffed up.  He is counseled to direct questions to his ENT. In the meantime, this is not harmful and he can procede with his exercises   Patient is accompained by: Family member   Currently in Pain? No/denies               ADULT SLP TREATMENT - 11/27/16 0001      General Information   Behavior/Cognition  Alert;Cooperative;Pleasant mood     Treatment Provided   Treatment provided Cognitive-Linquistic     Pain Assessment   Pain Assessment No/denies pain     Cognitive-Linquistic Treatment   Treatment focused on Voice;Other (comment)  Swallowing   Skilled Treatment VOICE and SWALLOWING EXERCISES:  Patient able to complete 6/6 exercises accurately.  VOCAL LOUDNESS: Patient maintain loud, good quality voice while completing simple cognitive linguistic tasks with 75% accuracy and min cues (reminders to be loud).     Assessment / Recommendations / Plan   Plan Continue with current plan of care     Progression Toward Goals   Progression toward goals Progressing toward goals          SLP Education - 11/27/16 1443    Education provided Yes   Education Details HEP, vocal loudness   Person(s) Educated Patient;Spouse   Methods Explanation   Comprehension Verbalized understanding            SLP Long Term Goals - 10/30/16 1348      SLP LONG TERM GOAL #1   Title Patient will independently perform selected voice and swallowing exercises given written instructions.   Time 8   Period Weeks   Status New     SLP LONG TERM GOAL #2  Title The patient will complete Hierarchal Speech Loudness reading drills (words/phrases, sentences, and paragraph) at average 75 dB and with loud, good quality voice.     Time 8   Period Weeks   Status New          Plan - 11/27/16 1444    Clinical Impression Statement The patient is able to perform the voice and swallowing exercises and is able to generate a loud, good quality voice.  He is adjusting to the level of effort required to generate a loud, good quality voice.   Speech Therapy Frequency 2x / week   Duration Other (comment)   Treatment/Interventions Pharyngeal strengthening exercises;Other (comment)  Voice therapy   Potential to Achieve Goals Good   Potential Considerations Ability to learn/carryover  information;Co-morbidities;Cooperation/participation level;Previous level of function;Severity of impairments;Family/community support   SLP Home Exercise Plan Patient given written swallowing and voice exercises with log for recording performance   Consulted and Agree with Plan of Care Patient;Family member/caregiver   Family Member Consulted Spouse      Patient will benefit from skilled therapeutic intervention in order to improve the following deficits and impairments:   Dysphonia  Dysphagia, pharyngeal phase    Problem List Patient Active Problem List   Diagnosis Date Noted  . Mood disorder ([place]) 10/23/2016  . Mild malnutrition ([place]) 07/27/2016  . Alzheimer's dementia, late onset 08/31/2015  . 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 Cough 07/25/2015  . Advance directive discussed with patient 01/17/2015  . Non-allergic rhinitis 06/29/2014  . Abnormality of lung on CXR 06/29/2014  . Trigger finger 04/07/2014  . [facility] DJD(carpometacarpal degenerative joint disease), localized primary 07/13/2013  . H/O malignant neoplasm of skin 06/29/2013  . BPH (benign prostatic hypertrophy)   . Routine general medical examination at a health care facility 05/30/2012  . Other constipation 11/26/2011  . GERD (gastroesophageal reflux disease)   . Osteoarthritis, multiple sites   . Neuropathy due to drug [facility])    [name], MS/CCC- SLP  [name] 11/27/2016, 2:44 PM  [place] MAIN [facility] SERVICES [address] [place], [place], [address] Phone: [phone]   Fax:  [phone]   Name: Corey Clark MRN: [id number] Date of Birth: [birth year]

## 2016-11-29 ENCOUNTER — Ambulatory Visit: Payer: Medicare Other | Admitting: Podiatry

## 2016-11-29 ENCOUNTER — Ambulatory Visit: Payer: Medicare Other | Admitting: Speech Pathology

## 2016-12-03 ENCOUNTER — Encounter: Payer: Medicare Other | Admitting: Speech Pathology

## 2016-12-05 ENCOUNTER — Ambulatory Visit: Payer: Medicare Other | Admitting: Speech Pathology

## 2016-12-05 ENCOUNTER — Encounter: Payer: Self-pay | Admitting: Speech Pathology

## 2016-12-05 DIAGNOSIS — R490 Dysphonia: Secondary | ICD-10-CM | POA: Diagnosis not present

## 2016-12-05 DIAGNOSIS — R1313 Dysphagia, pharyngeal phase: Secondary | ICD-10-CM

## 2016-12-05 NOTE — Therapy (Signed)
[place] MAIN [facility] SERVICES [address] [place], [place], [address] Phone: [phone]   Fax:  [phone]  Speech Language Pathology Treatment  Patient Details  Name: Corey Clark MRN: [id number] Date of Birth: 07-27-1925 Referring Provider: Dr. Pryor Clark  Encounter Date: 12/05/2016      End of Session - 12/05/16 1606    Visit Number 6   Number of Visits 17   Date for SLP Re-Evaluation 12/28/16   SLP Start Time 1500   SLP Stop Time  1600   SLP Time Calculation (min) 60 min   Activity Tolerance Patient tolerated treatment well      Past Medical History:  Diagnosis Date  . Alzheimer's dementia   . BPH (benign prostatic hypertrophy)   . Colon polyps   . GERD (gastroesophageal reflux disease)   . Neuropathy due to drug [facility])    chemo for stomach cancer  . Osteoarthrosis involving, or with mention of more than one site, but not specified as generalized, multiple sites   . SCC (squamous cell carcinoma), face 2014   left preauricular---[facility]  . Stomach cancer ([place]) 1999   MALT lymphoma --surgery then chemo. Bad infection after with 1 month hospitalization    Past Surgical History:  Procedure Laterality Date  . CATARACT EXTRACTION, BILATERAL  2002, 2003  . [place]  . PARTIAL GASTRECTOMY  1999   2 procedures for stomach cancer  . ROTATOR CUFF REPAIR  1991/1998   left and right done  . TENDON RELEASE Left 6/15   Dr Corey Clark    There were no vitals filed for this visit.      Subjective Assessment - 12/05/16 1605    Subjective The patient reports that when he speaks loudly his ears feel stuffed up.  He is counseled to direct questions to his ENT. In the meantime, this is not harmful and he can procede with his exercises   Patient is accompained by: Family member   Currently in Pain? No/denies               ADULT SLP TREATMENT - 12/05/16 0001      General Information   Behavior/Cognition  Alert;Cooperative;Pleasant mood     Treatment Provided   Treatment provided Cognitive-Linquistic     Pain Assessment   Pain Assessment No/denies pain     Cognitive-Linquistic Treatment   Treatment focused on Voice;Other (comment)  Swallowing   Skilled Treatment VOICE and SWALLOWING EXERCISES:  Patient able to complete 6/6 exercises accurately.  VOCAL LOUDNESS: Patient maintain loud, good quality voice while completing simple cognitive linguistic tasks with 75% accuracy and min cues (reminders to be loud).     Assessment / Recommendations / Plan   Plan Continue with current plan of care     Progression Toward Goals   Progression toward goals Progressing toward goals          SLP Education - 12/05/16 1605    Education provided Yes   Education Details Importance of nutrition and calorie intake   Person(s) Educated Patient;Spouse   Methods Explanation   Comprehension Verbalized understanding            SLP Long Term Goals - 10/30/16 1348      SLP LONG TERM GOAL #1   Title Patient will independently perform selected voice and swallowing exercises given written instructions.   Time 8   Period Weeks   Status New     SLP LONG TERM GOAL #  2   Title The patient will complete Hierarchal Speech Loudness reading drills (words/phrases, sentences, and paragraph) at average 75 dB and with loud, good quality voice.     Time 8   Period Weeks   Status New          Plan - 12/05/16 1606    Clinical Impression Statement The patient is able to perform the voice and swallowing exercises and is able to generate a loud, good quality voice.  He is adjusting to the level of effort required to generate a loud, good quality voice.   Speech Therapy Frequency 2x / week   Duration Other (comment)   Treatment/Interventions Pharyngeal strengthening exercises;Other (comment)  Voice therapy   Potential to Achieve Goals Good   Potential Considerations Ability to learn/carryover  information;Co-morbidities;Cooperation/participation level;Previous level of function;Severity of impairments;Family/community support   SLP Home Exercise Plan Patient given written swallowing and voice exercises with log for recording performance   Consulted and Agree with Plan of Care Patient;Family member/caregiver   Family Member Consulted Spouse      Patient will benefit from skilled therapeutic intervention in order to improve the following deficits and impairments:   Dysphonia  Dysphagia, pharyngeal phase    Problem List Patient Active Problem List   Diagnosis Date Noted  . Mood disorder ([place]) 10/23/2016  . Mild malnutrition ([place]) 07/27/2016  . Alzheimer's dementia, late onset 08/31/2015  . 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 Cough 07/25/2015  . Advance directive discussed with patient 01/17/2015  . Non-allergic rhinitis 06/29/2014  . Abnormality of lung on CXR 06/29/2014  . Trigger finger 04/07/2014  . [facility] DJD(carpometacarpal degenerative joint disease), localized primary 07/13/2013  . H/O malignant neoplasm of skin 06/29/2013  . BPH (benign prostatic hypertrophy)   . Routine general medical examination at a health care facility 05/30/2012  . Other constipation 11/26/2011  . GERD (gastroesophageal reflux disease)   . Osteoarthritis, multiple sites   . Neuropathy due to drug [facility])    Corey Sea, MS/CCC- SLP  Corey Clark 12/05/2016, 4:07 PM  [place] MAIN [facility] SERVICES [address] [place], [place], [address] Phone: [phone]   Fax:  [phone]   Name: Corey Clark MRN: [id number] Date of Birth: 1925/02/21

## 2016-12-10 ENCOUNTER — Ambulatory Visit: Payer: Medicare Other | Admitting: Speech Pathology

## 2016-12-10 ENCOUNTER — Ambulatory Visit (INDEPENDENT_AMBULATORY_CARE_PROVIDER_SITE_OTHER): Payer: Medicare Other | Admitting: Podiatry

## 2016-12-10 ENCOUNTER — Encounter: Payer: Self-pay | Admitting: Podiatry

## 2016-12-10 DIAGNOSIS — B351 Tinea unguium: Secondary | ICD-10-CM

## 2016-12-10 DIAGNOSIS — M79676 Pain in unspecified toe(s): Secondary | ICD-10-CM

## 2016-12-10 DIAGNOSIS — G620 Drug-induced polyneuropathy: Secondary | ICD-10-CM

## 2016-12-10 NOTE — Progress Notes (Signed)
Complaint:  Visit Type: Patient returns to my office for continued preventative foot care services. Complaint: Patient states" my nails have grown long and thick and become painful to walk and wear shoes" . The patient presents for preventative foot care services. No changes to ROS  Podiatric Exam: Vascular: dorsalis pedis and posterior tibial pulses are palpable bilateral. Capillary return is immediate. Temperature gradient is WNL. Skin turgor WNL  Sensorium: Normal Semmes Weinstein monofilament test. Normal tactile sensation bilaterally. Nail Exam: Pt has thick disfigured discolored nails with subungual debris noted bilateral entire nail hallux through fifth toenails Ulcer Exam: There is no evidence of ulcer or pre-ulcerative changes or infection. Orthopedic Exam: Muscle tone and strength are WNL. No limitations in general ROM. No crepitus or effusions noted. Foot type and digits show no abnormalities. Bony prominences are unremarkable. Skin: No Porokeratosis. No infection or ulcers  Diagnosis:  Onychomycosis, , Pain in right toe, pain in left toes  Treatment & Plan Procedures and Treatment: Consent by patient was obtained for treatment procedures. The patient understood the discussion of treatment and procedures well. All questions were answered thoroughly reviewed. Debridement of mycotic and hypertrophic toenails, 1 through 5 bilateral and clearing of subungual debris. No ulceration, no infection noted.  Return Visit-Office Procedure: Patient instructed to return to the office for a follow up visit 3 months   for continued evaluation and treatment.    Cobe Viney DPM 

## 2016-12-11 ENCOUNTER — Ambulatory Visit (INDEPENDENT_AMBULATORY_CARE_PROVIDER_SITE_OTHER): Payer: Medicare Other | Admitting: Internal Medicine

## 2016-12-11 ENCOUNTER — Ambulatory Visit: Payer: Medicare Other | Admitting: Speech Pathology

## 2016-12-11 ENCOUNTER — Encounter: Payer: Self-pay | Admitting: Internal Medicine

## 2016-12-11 DIAGNOSIS — G301 Alzheimer's disease with late onset: Secondary | ICD-10-CM

## 2016-12-11 DIAGNOSIS — E441 Mild protein-calorie malnutrition: Secondary | ICD-10-CM | POA: Diagnosis not present

## 2016-12-11 DIAGNOSIS — F0280 Dementia in other diseases classified elsewhere without behavioral disturbance: Secondary | ICD-10-CM | POA: Diagnosis not present

## 2016-12-11 MED ORDER — MEMANTINE HCL 5 MG PO TABS: 5.0000 mg | ORAL_TABLET | Freq: Two times a day (BID) | ORAL | 3 refills | Status: AC

## 2016-12-11 NOTE — Patient Instructions (Addendum)
Please call for a consultation with Lutricia Horsfall --the dementia specialist at [facility] [phone]).  Please restart the memantine at 5mg  twice a day.

## 2016-12-11 NOTE — Assessment & Plan Note (Addendum)
Worse since last visit ?related to stopping the memantine Didn't seem to have this reaction stopping the donepezil Will restart the memantine at 5 bid and see him back in 2-3 weeks Discussed caregiver stress with wife--should try to increase time at the [facility]

## 2016-12-11 NOTE — Progress Notes (Signed)
Subjective:    Patient ID: Corey Clark, male    DOB: [birth year], 83 y.o.   MRN: [id number]  HPI Here for follow up of the dementia Did have follow up with neurologist at [facility] to work with the speech therapist---he is not sure if this is helping Does do the home exercises Off donepezil and namenda Also off the mirtazapine Still sleeping a lot Also seems to be having more memory and functional problems Questions what the toilet is and how it works, Social research officer, government  Eating continues to be a big problem Intermittent nausea He feels his appetite is better---but not consistently per wife Does like the food from [place] stand by supervision for showers Some trouble dressing--- was trying to put wife's pants on  Occasional trouble dressing--figuring it  Occasional bowel and bladder incontinence  Current Outpatient Prescriptions on File Prior to Visit  Medication Sig Dispense Refill  . ipratropium (ATROVENT) 0.03 % nasal spray Place 2 sprays into both nostrils 3 (three) times daily.    . Lactulose 20 GM/30ML SOLN Take 30 mLs (20 g total) by mouth daily as needed. 60 mL 0  . Multiple Vitamin (MULTIVITAMIN) tablet Take 1 tablet by mouth daily.    . sucralfate (CARAFATE) 1 GM/10ML suspension Take 1 g by mouth daily.     . vitamin B-12 (CYANOCOBALAMIN) 500 MCG tablet Take 500 mcg by mouth daily.     No current facility-administered medications on file prior to visit.     Allergies  Allergen Reactions  . Sulfa Antibiotics Nausea Only    Patient not sure it's been years since he's taken med  . Tetracycline Other (See Comments)    Sick to stomach  . Tetracyclines & Related Other (See Comments)    Patient not sure it's been years since he's taken med    Past Medical History:  Diagnosis Date  . Alzheimer's dementia   . BPH (benign prostatic hypertrophy)   . Colon polyps   . GERD (gastroesophageal reflux disease)   . Neuropathy due to drug [facility])    chemo for stomach  cancer  . Osteoarthrosis involving, or with mention of more than one site, but not specified as generalized, multiple sites   . SCC (squamous cell carcinoma), face 2014   left preauricular---[facility]  . Stomach cancer ([place]) 1999   MALT lymphoma --surgery then chemo. Bad infection after with 1 month hospitalization    Past Surgical History:  Procedure Laterality Date  . CATARACT EXTRACTION, BILATERAL  2002, 2003  . [place]  . PARTIAL GASTRECTOMY  1999   2 procedures for stomach cancer  . ROTATOR CUFF REPAIR  1991/1998   left and right done  . TENDON RELEASE Left 6/15   Dr Jefm Bryant    Family History  Problem Relation Age of Onset  . Cancer Brother   . Heart disease Neg Hx   . Hypertension Neg Hx   . Diabetes Neg Hx     Social History   Social History  . Marital status: Married    Spouse name: N/A  . Number of children: 3  . Years of education: N/A   Occupational History  . 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 Smoking status: Former Smoker    Quit date: 10/15/1950  . Smokeless tobacco: Never Used  . Alcohol use Yes     Comment: rare wine  . Drug use:  No  . Sexual activity: No   Other Topics Concern  . Not on file   Social History Narrative   3 children in [place]      Has living will    Wife, then son, Jemel Haris., to be health care POA   Would accept resuscitation attempts   Not sure about tube feeds-- probably doesn't want if cognitively unaware   Review of Systems Still notes echo sensation in left ear-- will be seeing Dr Pryor Ochoa this afternoon Goes twice a week to the [employer] Did have a fall at the [facility] recently    Objective:   Physical Exam  Constitutional: No distress.  Neurological:  Much more passive Confusion considerably more noticeable          Assessment & Plan:

## 2016-12-11 NOTE — Assessment & Plan Note (Signed)
Some further weight loss Still somnolent despite off the mirtazapine Will restart the memantine and reconsider mirtazapine later

## 2016-12-11 NOTE — Progress Notes (Signed)
Pre-visit discussion using our clinic review tool. No additional management support is needed unless otherwise documented below in the visit note.  

## 2016-12-12 ENCOUNTER — Ambulatory Visit: Payer: Medicare Other | Admitting: Speech Pathology

## 2016-12-13 ENCOUNTER — Ambulatory Visit: Payer: Medicare Other | Attending: Otolaryngology | Admitting: Speech Pathology

## 2016-12-13 DIAGNOSIS — R1313 Dysphagia, pharyngeal phase: Secondary | ICD-10-CM | POA: Insufficient documentation

## 2016-12-13 DIAGNOSIS — R490 Dysphonia: Secondary | ICD-10-CM | POA: Insufficient documentation

## 2016-12-14 ENCOUNTER — Encounter: Payer: Self-pay | Admitting: Speech Pathology

## 2016-12-14 NOTE — Therapy (Signed)
[place] MAIN [facility] SERVICES [address] [place], [place], [address] Phone: [phone]   Fax:  [phone]  Speech Language Pathology Treatment  Patient Details  Name: Corey Clark MRN: [id number] Date of Birth: 08/31/1925 Referring Provider: Dr. Pryor Ochoa  Encounter Date: 12/13/2016      End of Session - 12/14/16 0842    Visit Number 7   Number of Visits 17   Date for SLP Re-Evaluation 12/28/16   SLP Start Time 1600   SLP Stop Time  [id number]   SLP Time Calculation (min) 53 min   Activity Tolerance Patient tolerated treatment well      Past Medical History:  Diagnosis Date  . Alzheimer's dementia   . BPH (benign prostatic hypertrophy)   . Colon polyps   . GERD (gastroesophageal reflux disease)   . Neuropathy due to drug [facility])    chemo for stomach cancer  . Osteoarthrosis involving, or with mention of more than one site, but not specified as generalized, multiple sites   . SCC (squamous cell carcinoma), face 2014   left preauricular---[facility]  . Stomach cancer ([place]) 1999   MALT lymphoma --surgery then chemo. Bad infection after with 1 month hospitalization    Past Surgical History:  Procedure Laterality Date  . CATARACT EXTRACTION, BILATERAL  2002, 2003  . [employer]  . PARTIAL GASTRECTOMY  1999   2 procedures for stomach cancer  . ROTATOR CUFF REPAIR  1991/1998   left and right done  . TENDON RELEASE Left 6/15   Dr Jefm Bryant    There were no vitals filed for this visit.      Subjective Assessment - 12/14/16 0841    Subjective The patient feels that a normal loudness level is too loud, but he agrees to use a loud voice for exercises and when requested by listeners   Patient is accompained by: Family member   Currently in Pain? No/denies               ADULT SLP TREATMENT - 12/14/16 0001      General Information   Behavior/Cognition Alert;Cooperative;Pleasant mood     Treatment Provided    Treatment provided Dysphagia;Cognitive-Linquistic     Pain Assessment   Pain Assessment No/denies pain     Cognitive-Linquistic Treatment   Treatment focused on Voice;Other (comment)  Swallowing   Skilled Treatment VOICE and SWALLOWING EXERCISES:  Patient able to complete 5/5 exercises accurately.  VOCAL LOUDNESS: Patient maintain loud, good quality voice while completing simple cognitive linguistic tasks with 75% accuracy and min cues (reminders to be loud).     Assessment / Recommendations / Plan   Plan Continue with current plan of care     Progression Toward Goals   Progression toward goals Progressing toward goals          SLP Education - 12/14/16 0841    Education provided Yes   Education Details Need to continue to exercise his voice and swallowing muscles so that he can retain these skills.   Person(s) Educated Patient;Spouse   Methods Explanation   Comprehension Verbalized understanding            SLP Long Term Goals - 10/30/16 1348      SLP LONG TERM GOAL #1   Title Patient will independently perform selected voice and swallowing exercises given written instructions.   Time 8   Period Weeks   Status New     SLP LONG TERM GOAL #  2   Title The patient will complete Hierarchal Speech Loudness reading drills (words/phrases, sentences, and paragraph) at average 75 dB and with loud, good quality voice.     Time 8   Period Weeks   Status New          Plan - 12/14/16 [id number]    Clinical Impression Statement The patient is able to perform the voice and swallowing exercises and is able to generate a loud, good quality voice.  He is adjusting to the level of effort required to generate a loud, good quality voice.   Speech Therapy Frequency 2x / week   Duration Other (comment)   Treatment/Interventions Pharyngeal strengthening exercises;Other (comment)  Voice therapy   Potential to Achieve Goals Good   Potential Considerations Ability to learn/carryover  information;Co-morbidities;Cooperation/participation level;Previous level of function;Severity of impairments;Family/community support   SLP Home Exercise Plan Patient given written swallowing and voice exercises with log for recording performance   Consulted and Agree with Plan of Care Patient;Family member/caregiver   Family Member Consulted Spouse      Patient will benefit from skilled therapeutic intervention in order to improve the following deficits and impairments:   Dysphonia  Dysphagia, pharyngeal phase    Problem List Patient Active Problem List   Diagnosis Date Noted  . Mood disorder ([place]) 10/23/2016  . Mild malnutrition ([place]) 07/27/2016  . Alzheimer's dementia, late onset 08/31/2015  . 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 Cough 07/25/2015  . Advance directive discussed with patient 01/17/2015  . Non-allergic rhinitis 06/29/2014  . Abnormality of lung on CXR 06/29/2014  . Trigger finger 04/07/2014  . [facility] DJD(carpometacarpal degenerative joint disease), localized primary 07/13/2013  . H/O malignant neoplasm of skin 06/29/2013  . BPH (benign prostatic hypertrophy)   . Routine general medical examination at a health care facility 05/30/2012  . Other constipation 11/26/2011  . GERD (gastroesophageal reflux disease)   . Osteoarthritis, multiple sites   . Neuropathy due to drug [facility])    [name], MS/CCC- SLP  [name] 12/14/2016, 8:44 AM  [place] MAIN [facility] SERVICES [address] [place], [place], [address] Phone: [phone]   Fax:  [phone]   Name: Corey Clark MRN: [id number] Date of Birth: 03-Jan-1925

## 2016-12-17 ENCOUNTER — Ambulatory Visit: Payer: Medicare Other | Admitting: Speech Pathology

## 2016-12-18 ENCOUNTER — Ambulatory Visit: Payer: Medicare Other | Admitting: Speech Pathology

## 2016-12-18 ENCOUNTER — Encounter: Payer: Self-pay | Admitting: Speech Pathology

## 2016-12-18 DIAGNOSIS — R490 Dysphonia: Secondary | ICD-10-CM

## 2016-12-18 DIAGNOSIS — R1313 Dysphagia, pharyngeal phase: Secondary | ICD-10-CM

## 2016-12-18 NOTE — Therapy (Signed)
[place] MAIN [facility] SERVICES [address] [place], [place], [address] Phone: [phone]   Fax:  [phone]  Speech Language Pathology Treatment  Patient Details  Name: Corey Clark MRN: [id number] Date of Birth: Mar 08, 1925 Referring Provider: Dr. Pryor Ochoa  Encounter Date: 12/18/2016      End of Session - 12/18/16 1703    Visit Number 8   Number of Visits 17   Date for SLP Re-Evaluation 12/28/16   SLP Start Time 40   SLP Stop Time  1657   SLP Time Calculation (min) 57 min   Activity Tolerance Patient tolerated treatment well      Past Medical History:  Diagnosis Date  . Alzheimer's dementia   . BPH (benign prostatic hypertrophy)   . Colon polyps   . GERD (gastroesophageal reflux disease)   . Neuropathy due to drug [facility])    chemo for stomach cancer  . Osteoarthrosis involving, or with mention of more than one site, but not specified as generalized, multiple sites   . SCC (squamous cell carcinoma), face 2014   left preauricular---[facility]  . Stomach cancer ([place]) 1999   MALT lymphoma --surgery then chemo. Bad infection after with 1 month hospitalization    Past Surgical History:  Procedure Laterality Date  . CATARACT EXTRACTION, BILATERAL  2002, 2003  . [place]  . PARTIAL GASTRECTOMY  1999   2 procedures for stomach cancer  . ROTATOR CUFF REPAIR  1991/1998   left and right done  . TENDON RELEASE Left 6/15   Dr Jefm Bryant    There were no vitals filed for this visit.      Subjective Assessment - 12/18/16 1703    Subjective The patient feels that a normal loudness level is too loud, but he agrees to use a loud voice for exercises and when requested by listeners   Patient is accompained by: Family member   Currently in Pain? No/denies               ADULT SLP TREATMENT - 12/18/16 0001      General Information   Behavior/Cognition Alert;Cooperative;Pleasant mood     Treatment Provided    Treatment provided Dysphagia;Cognitive-Linquistic     Pain Assessment   Pain Assessment No/denies pain     Cognitive-Linquistic Treatment   Treatment focused on Voice;Other (comment)  Swallowing   Skilled Treatment VOICE and SWALLOWING EXERCISES:  Patient able to complete 5/5 exercises accurately.  VOCAL LOUDNESS: Patient maintain loud, good quality voice while completing simple cognitive linguistic tasks with 75% accuracy and min cues (reminders to be loud).     Assessment / Recommendations / Plan   Plan Continue with current plan of care     Progression Toward Goals   Progression toward goals Progressing toward goals          SLP Education - 12/18/16 1703    Education provided Yes   Education Details Wife instructed in guiding patient through exercises   Person(s) Educated Patient;Spouse   Methods Explanation   Comprehension Verbalized understanding            SLP Long Term Goals - 10/30/16 1348      SLP LONG TERM GOAL #1   Title Patient will independently perform selected voice and swallowing exercises given written instructions.   Time 8   Period Weeks   Status New     SLP LONG TERM GOAL #2   Title The patient will complete Hierarchal Speech  Loudness reading drills (words/phrases, sentences, and paragraph) at average 75 dB and with loud, good quality voice.     Time 8   Period Weeks   Status New          Plan - 12/18/16 1703    Clinical Impression Statement The patient is able to perform the voice and swallowing exercises (best when given a model and instructions) and is able to generate a loud, good quality voice. The patient requires cues to maintain loud, good quality voice.   Speech Therapy Frequency 2x / week   Duration Other (comment)   Treatment/Interventions Pharyngeal strengthening exercises;Other (comment)  Voice therapy   Potential to Achieve Goals Good   Potential Considerations Ability to learn/carryover  information;Co-morbidities;Cooperation/participation level;Previous level of function;Severity of impairments;Family/community support   SLP Home Exercise Plan Patient given written swallowing and voice exercises with log for recording performance   Consulted and Agree with Plan of Care Patient;Family member/caregiver   Family Member Consulted Spouse      Patient will benefit from skilled therapeutic intervention in order to improve the following deficits and impairments:   Dysphonia  Dysphagia, pharyngeal phase    Problem List Patient Active Problem List   Diagnosis Date Noted  . Mood disorder ([place]) 10/23/2016  . Mild malnutrition ([place]) 07/27/2016  . Alzheimer's dementia, late onset 08/31/2015  . 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 Cough 07/25/2015  . Advance directive discussed with patient 01/17/2015  . Non-allergic rhinitis 06/29/2014  . Abnormality of lung on CXR 06/29/2014  . Trigger finger 04/07/2014  . [facility] DJD(carpometacarpal degenerative joint disease), localized primary 07/13/2013  . H/O malignant neoplasm of skin 06/29/2013  . BPH (benign prostatic hypertrophy)   . Routine general medical examination at a health care facility 05/30/2012  . Other constipation 11/26/2011  . GERD (gastroesophageal reflux disease)   . Osteoarthritis, multiple sites   . Neuropathy due to drug [facility])    [name], MS/CCC- SLP  [name] 12/18/2016, 5:04 PM  [place] MAIN [facility] SERVICES [address] [place], [place], [address] Phone: [phone]   Fax:  [phone]   Name: Corey Clark MRN: [id number] Date of Birth: 31-Jul-1925

## 2016-12-19 ENCOUNTER — Ambulatory Visit: Payer: Medicare Other | Admitting: Speech Pathology

## 2016-12-19 ENCOUNTER — Emergency Department: Payer: Medicare Other

## 2016-12-19 ENCOUNTER — Other Ambulatory Visit: Payer: Self-pay

## 2016-12-19 ENCOUNTER — Emergency Department
Admission: EM | Admit: 2016-12-19 | Discharge: 2016-12-19 | Disposition: A | Discharge status: Home / Self Care | Payer: Medicare Other | Attending: Emergency Medicine | Admitting: Emergency Medicine

## 2016-12-19 DIAGNOSIS — W1800XA Striking against unspecified object with subsequent fall, initial encounter: Secondary | ICD-10-CM | POA: Diagnosis not present

## 2016-12-19 DIAGNOSIS — S51811A Laceration without foreign body of right forearm, initial encounter: Secondary | ICD-10-CM | POA: Diagnosis not present

## 2016-12-19 DIAGNOSIS — G309 Alzheimer's disease, unspecified: Secondary | ICD-10-CM | POA: Insufficient documentation

## 2016-12-19 DIAGNOSIS — T148XXA Other injury of unspecified body region, initial encounter: Secondary | ICD-10-CM

## 2016-12-19 DIAGNOSIS — Z79899 Other long term (current) drug therapy: Secondary | ICD-10-CM | POA: Insufficient documentation

## 2016-12-19 DIAGNOSIS — Y999 Unspecified external cause status: Secondary | ICD-10-CM | POA: Diagnosis not present

## 2016-12-19 DIAGNOSIS — Z87891 Personal history of nicotine dependence: Secondary | ICD-10-CM | POA: Insufficient documentation

## 2016-12-19 DIAGNOSIS — Y929 Unspecified place or not applicable: Secondary | ICD-10-CM | POA: Diagnosis not present

## 2016-12-19 DIAGNOSIS — Y9389 Activity, other specified: Secondary | ICD-10-CM | POA: Insufficient documentation

## 2016-12-19 DIAGNOSIS — S0990XA Unspecified injury of head, initial encounter: Secondary | ICD-10-CM | POA: Diagnosis present

## 2016-12-19 DIAGNOSIS — S0001XA Abrasion of scalp, initial encounter: Secondary | ICD-10-CM | POA: Diagnosis not present

## 2016-12-19 DIAGNOSIS — W19XXXA Unspecified fall, initial encounter: Secondary | ICD-10-CM

## 2016-12-19 NOTE — ED Triage Notes (Signed)
Pt states he got tangled up in his walker and fell this morning. Pt has a skin tear to the right side of his head and to the right arm.. Pt is a/ox3 .Marland Kitchendenies LOC, N/V/visual changes at present.

## 2016-12-19 NOTE — Discharge Instructions (Signed)
Please seek medical attention for any high fevers, chest pain, shortness of breath, change in behavior, persistent vomiting, bloody stool or any other new or concerning symptoms.  

## 2016-12-19 NOTE — ED Provider Notes (Signed)
[facility] Emergency Department Provider Note __________________________________________   I have reviewed the triage vital signs and the nursing notes.   HISTORY  Chief Complaint Fall   History limited by: Not Limited   HPI Corey Clark is a 83 y.o. male who presents to the emergency department today because of concerns for skin tears and head injury after fall. Patient states that he was getting out of bed when he got caught up with his walker. He fell forward. He did hit his head and his right forearm. He denies any loss of consciousness. States no recent illnesses. No fevers, chest pain or shortness breath.  Patient has a history of falls and wife states that the patient has an old skin tear to the right elbow from a fall two days ago.    Past Medical History:  Diagnosis Date  . Alzheimer's dementia   . BPH (benign prostatic hypertrophy)   . Colon polyps   . GERD (gastroesophageal reflux disease)   . Neuropathy due to drug [facility])    chemo for stomach cancer  . Osteoarthrosis involving, or with mention of more than one site, but not specified as generalized, multiple sites   . SCC (squamous cell carcinoma), face 2014   left preauricular---[facility]  . Stomach cancer ([place]) 1999   MALT lymphoma --surgery then chemo. Bad infection after with 1 month hospitalization    Patient Active Problem List   Diagnosis Date Noted  . Mood disorder ([place]) 10/23/2016  . Mild malnutrition ([place]) 07/27/2016  . Alzheimer's dementia, late onset 08/31/2015  . 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 Cough 07/25/2015  . Advance directive discussed with patient 01/17/2015  . Non-allergic rhinitis 06/29/2014  . Abnormality of lung on CXR 06/29/2014  . Trigger finger 04/07/2014  . [facility] DJD(carpometacarpal degenerative joint disease), localized primary 07/13/2013  . H/O malignant neoplasm of skin 06/29/2013  . BPH (benign prostatic hypertrophy)   . Routine general medical  examination at a health care facility 05/30/2012  . Other constipation 11/26/2011  . GERD (gastroesophageal reflux disease)   . Osteoarthritis, multiple sites   . Neuropathy due to drug [facility])     Past Surgical History:  Procedure Laterality Date  . CATARACT EXTRACTION, BILATERAL  2002, 2003  . [place]  . PARTIAL GASTRECTOMY  1999   2 procedures for stomach cancer  . ROTATOR CUFF REPAIR  1991/1998   left and right done  . TENDON RELEASE Left 6/15   Dr Jefm Bryant    Prior to Admission medications   Medication Sig Start Date End Date Taking? Authorizing Provider  ipratropium (ATROVENT) 0.03 % nasal spray Place 2 sprays into both nostrils 3 (three) times daily.    Historical Provider, MD  Lactulose 20 GM/30ML SOLN Take 30 mLs (20 g total) by mouth daily as needed. 08/16/16   Anne-Caroline Mariea Clonts, MD  memantine (NAMENDA) 5 MG tablet Take 1 tablet (5 mg total) by mouth 2 (two) times daily. 12/11/16   Venia Carbon, MD  Multiple Vitamin (MULTIVITAMIN) tablet Take 1 tablet by mouth daily.    Historical Provider, MD  sucralfate (CARAFATE) 1 GM/10ML suspension Take 1 g by mouth daily.  07/29/14   Historical Provider, MD  vitamin B-12 (CYANOCOBALAMIN) 500 MCG tablet Take 500 mcg by mouth daily.    Historical Provider, MD    Allergies Sulfa antibiotics; Tetracycline; and Tetracyclines & related  Family History  Problem Relation Age of Onset  . Cancer Brother   .  Heart disease Neg Hx   . Hypertension Neg Hx   . Diabetes Neg Hx     Social History Social History  Substance Use Topics  . Smoking status: Former Smoker    Quit date: 10/15/1950  . Smokeless tobacco: Never Used  . Alcohol use Yes     Comment: rare wine    Review of Systems  Constitutional: Negative for fever. Cardiovascular: Negative for chest pain. Respiratory: Negative for shortness of breath. Gastrointestinal: Negative for abdominal pain, vomiting and diarrhea. Genitourinary: Negative for  dysuria. Musculoskeletal: Negative for back pain. Skin: skin tear to right forearm. Neurological: Negative for headaches, focal weakness or numbness.  10-point ROS otherwise negative.  ____________________________________________   PHYSICAL EXAM:  VITAL SIGNS: ED Triage Vitals [12/19/16 1032]  Enc Vitals Group     BP (!) 96/55     Pulse Rate 81     Resp 20     Temp 97 F (36.1 C)     Temp Source Oral     SpO2 96 %    Constitutional: Alert and oriented. Well appearing and in no distress. Eyes: Conjunctivae are normal. Normal extraocular movements. ENT   Head: Normocephalic and atraumatic.   Nose: No congestion/rhinnorhea.   Mouth/Throat: Mucous membranes are moist.   Neck: No stridor. Hematological/Lymphatic/Immunilogical: No cervical lymphadenopathy. Cardiovascular: Normal rate, regular rhythm.  No murmurs, rubs, or gallops.  Respiratory: Normal respiratory effort without tachypnea nor retractions. Breath sounds are clear and equal bilaterally. No wheezes/rales/rhonchi. Gastrointestinal: Soft and non tender. No rebound. No guarding.  Genitourinary: Deferred Musculoskeletal: Normal range of motion in all extremities. No lower extremity edema. Neurologic:  Normal speech and language. No gross focal neurologic deficits are appreciated.  Skin:  Small abrasion to right parietal scalp, hemostatic. Two skin tears to right forearm.  Psychiatric: Mood and affect are normal. Speech and behavior are normal. Patient exhibits appropriate insight and judgment.  ____________________________________________    LABS (pertinent positives/negatives)  Labs Reviewed - No data to display   ____________________________________________   EKG  I, Nance Pear, attending physician, personally viewed and interpreted this EKG  EKG Time: 1043 Rate: 79 Rhythm: normal sinus rhythm Axis: normal Intervals: qtc 493 QRS: incomplete RBBB ST changes: no st  elevation Impression: abnormal ekg   ____________________________________________    RADIOLOGY  CT head IMPRESSION:  No acute intracranial abnormality.    Stable near complete opacification of the left maxillary sinus.    ____________________________________________   PROCEDURES  Procedures  ____________________________________________   INITIAL IMPRESSION / ASSESSMENT AND PLAN / ED COURSE  Pertinent labs & imaging results that were available during my care of the patient were reviewed by me and considered in my medical decision making (see chart for details).  Patient presented to the emergency department today because of concerns for fall and skin tears. On exam patient appears well. He does have 2 skin tears to his right forearm, the one at the elbow well-healing. Additionally patient has a small abrasion to his right parietal scalp. This is hemostatic. Nothing to repair. Patient had a head CT which was negative. Will plan on discharging home.  ____________________________________________   FINAL CLINICAL IMPRESSION(S) / ED DIAGNOSES  Final diagnoses:  Fall, initial encounter  Skin tear of right forearm without complication, initial encounter  Abrasion     Note: This dictation was prepared with Dragon dictation. Any transcriptional errors that result from this process are unintentional     Nance Pear, MD 12/19/16 1413

## 2016-12-20 ENCOUNTER — Ambulatory Visit: Payer: Medicare Other | Admitting: Speech Pathology

## 2016-12-24 ENCOUNTER — Ambulatory Visit: Payer: Medicare Other | Admitting: Speech Pathology

## 2016-12-26 ENCOUNTER — Ambulatory Visit: Payer: Medicare Other | Admitting: Speech Pathology

## 2017-01-01 ENCOUNTER — Ambulatory Visit (INDEPENDENT_AMBULATORY_CARE_PROVIDER_SITE_OTHER): Payer: Medicare Other | Admitting: Internal Medicine

## 2017-01-01 ENCOUNTER — Encounter: Payer: Self-pay | Admitting: Internal Medicine

## 2017-01-01 VITALS — BP 110/60 | HR 89 | Temp 97.4°F | Wt 150.0 lb

## 2017-01-01 DIAGNOSIS — F0280 Dementia in other diseases classified elsewhere without behavioral disturbance: Secondary | ICD-10-CM | POA: Diagnosis not present

## 2017-01-01 DIAGNOSIS — E441 Mild protein-calorie malnutrition: Secondary | ICD-10-CM | POA: Diagnosis not present

## 2017-01-01 DIAGNOSIS — G301 Alzheimer's disease with late onset: Secondary | ICD-10-CM | POA: Diagnosis not present

## 2017-01-01 MED ORDER — MIRTAZAPINE 15 MG PO TABS: 15.0000 mg | ORAL_TABLET | Freq: Every day | ORAL | 3 refills | Status: AC

## 2017-01-01 NOTE — Assessment & Plan Note (Signed)
Will restart the mirtazapine--clearly wasn't causing the excessive sleep (since it persists)

## 2017-01-01 NOTE — Progress Notes (Signed)
Pre visit review using our clinic review tool, if applicable. No additional management support is needed unless otherwise documented below in the visit note. 

## 2017-01-01 NOTE — Assessment & Plan Note (Signed)
Slow decline Will continue current memantine but not increase Discussed increasing time at [facility] day program--- does better with the stimulation

## 2017-01-01 NOTE — Patient Instructions (Signed)
Please restart the mirtazapine 15mg  at bedtime.

## 2017-01-01 NOTE — Progress Notes (Signed)
Subjective:    Patient ID: Corey Clark, male    DOB: 11-09-1924, 83 y.o.   MRN: [id number]  HPI Here with wife for follow up of Alzheimers  No clear improvement back on the memantine Sleeps a lot still--at least 12 hours (and may nap after meals) Goes to [employer] 3 days a week--these days are better.  Not eating well still Has lost more weight Very variable No clear depression-- generally enjoys the [place] Wife needs to supervise all ADLs Has had rare times where he forgot the purpose of a toilet  Current Outpatient Prescriptions on File Prior to Visit  Medication Sig Dispense Refill  . ipratropium (ATROVENT) 0.03 % nasal spray Place 2 sprays into both nostrils 3 (three) times daily.    . Lactulose 20 GM/30ML SOLN Take 30 mLs (20 g total) by mouth daily as needed. 60 mL 0  . memantine (NAMENDA) 5 MG tablet Take 1 tablet (5 mg total) by mouth 2 (two) times daily. 60 tablet 3  . Multiple Vitamin (MULTIVITAMIN) tablet Take 1 tablet by mouth daily.    . sucralfate (CARAFATE) 1 GM/10ML suspension Take 1 g by mouth daily.     . vitamin B-12 (CYANOCOBALAMIN) 500 MCG tablet Take 500 mcg by mouth daily.     No current facility-administered medications on file prior to visit.     Allergies  Allergen Reactions  . Sulfa Antibiotics Nausea Only    Patient not sure it's been years since he's taken med  . Tetracycline Other (See Comments)    Sick to stomach  . Tetracyclines & Related Other (See Comments)    Patient not sure it's been years since he's taken med    Past Medical History:  Diagnosis Date  . Alzheimer's dementia   . BPH (benign prostatic hypertrophy)   . Colon polyps   . GERD (gastroesophageal reflux disease)   . Neuropathy due to drug [facility])    chemo for stomach cancer  . Osteoarthrosis involving, or with mention of more than one site, but not specified as generalized, multiple sites   . SCC (squamous cell carcinoma), face 2014   left preauricular---[facility]   . Stomach cancer ([place]) 1999   MALT lymphoma --surgery then chemo. Bad infection after with 1 month hospitalization    Past Surgical History:  Procedure Laterality Date  . CATARACT EXTRACTION, BILATERAL  2002, 2003  . [place]  . PARTIAL GASTRECTOMY  1999   2 procedures for stomach cancer  . ROTATOR CUFF REPAIR  1991/1998   left and right done  . TENDON RELEASE Left 6/15   Dr Jefm Bryant    Family History  Problem Relation Age of Onset  . Cancer Brother   . Heart disease Neg Hx   . Hypertension Neg Hx   . Diabetes Neg Hx     Social History   Social History  . Marital status: Married    Spouse name: N/A  . Number of children: 3  . Years of education: N/A   Occupational History  . 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 Smoking status: Former Smoker    Quit date: 10/15/1950  . Smokeless tobacco: Never Used  . Alcohol use Yes     Comment: rare wine  . Drug use: No  . Sexual activity: No   Other Topics Concern  . Not on file   Social History Narrative  3 children in [place]      Has living will    Wife, then son, Corey Clark., to be health care POA   Would accept resuscitation attempts   Not sure about tube feeds-- probably doesn't want if cognitively unaware   Review of Systems Done with the swallowing sessions Bowels okay No SOB No chest pain Occasional stomach upset--tums helps    Objective:   Physical Exam  Constitutional: No distress.  Neurological:  Very passive but does answer questions  Psychiatric:  Flat affect but not depressed          Assessment & Plan:

## 2017-01-13 ENCOUNTER — Other Ambulatory Visit: Payer: Self-pay | Admitting: Internal Medicine

## 2017-01-14 ENCOUNTER — Ambulatory Visit (INDEPENDENT_AMBULATORY_CARE_PROVIDER_SITE_OTHER)
Admission: RE | Admit: 2017-01-14 | Discharge: 2017-01-14 | Disposition: A | Discharge status: Home / Self Care | Payer: Medicare Other | Source: Ambulatory Visit | Attending: Internal Medicine | Admitting: Internal Medicine

## 2017-01-14 ENCOUNTER — Ambulatory Visit (INDEPENDENT_AMBULATORY_CARE_PROVIDER_SITE_OTHER): Payer: Medicare Other | Admitting: Internal Medicine

## 2017-01-14 ENCOUNTER — Ambulatory Visit: Payer: Medicare Other | Admitting: Family Medicine

## 2017-01-14 ENCOUNTER — Encounter: Payer: Self-pay | Admitting: Internal Medicine

## 2017-01-14 VITALS — BP 112/74 | HR 68 | Temp 97.7°F | Resp 24 | Wt 146.0 lb

## 2017-01-14 DIAGNOSIS — J181 Lobar pneumonia, unspecified organism: Secondary | ICD-10-CM | POA: Diagnosis not present

## 2017-01-14 DIAGNOSIS — R05 Cough: Secondary | ICD-10-CM

## 2017-01-14 DIAGNOSIS — R059 Cough, unspecified: Secondary | ICD-10-CM

## 2017-01-14 MED ORDER — LEVOFLOXACIN 500 MG PO TABS: 500.0000 mg | ORAL_TABLET | Freq: Every day | ORAL | 0 refills | Status: AC

## 2017-01-14 MED ORDER — CEFTRIAXONE SODIUM 1 G IJ SOLR
1.0000 g | Freq: Once | INTRAMUSCULAR | Status: AC
  Administered 2017-01-14: 1 g via INTRAMUSCULAR

## 2017-01-14 NOTE — Progress Notes (Signed)
Subjective:    Patient ID: Corey Clark, male    DOB: 1925/04/10, 83 y.o.   MRN: [id number]  HPI Here with wife due to cough  Bad cough 3 days ago No clear problems at [facility] that day No apparent fever No SOB--but he does feel mucus in his throat Wife hears noise in his chest also Cough is productive  No meds for this over the weekend  Current Outpatient Prescriptions on File Prior to Visit  Medication Sig Dispense Refill  . CARAFATE 1 GM/10ML suspension TAKE 10 MLS (1 GRAM TOTAL) TWO TIMES DAILY 1680 mL 2  . ipratropium (ATROVENT) 0.03 % nasal spray Place 2 sprays into both nostrils 3 (three) times daily.    . Lactulose 20 GM/30ML SOLN Take 30 mLs (20 g total) by mouth daily as needed. 60 mL 0  . memantine (NAMENDA) 5 MG tablet Take 1 tablet (5 mg total) by mouth 2 (two) times daily. 60 tablet 3  . mirtazapine (REMERON) 15 MG tablet Take 1 tablet (15 mg total) by mouth at bedtime. 90 tablet 3  . Multiple Vitamin (MULTIVITAMIN) tablet Take 1 tablet by mouth daily.    . vitamin B-12 (CYANOCOBALAMIN) 500 MCG tablet Take 500 mcg by mouth daily.     No current facility-administered medications on file prior to visit.     Allergies  Allergen Reactions  . Tetracycline Other (See Comments)    Sick to stomach  . Tetracyclines & Related Other (See Comments)    Patient not sure it's been years since he's taken med    Past Medical History:  Diagnosis Date  . Alzheimer's dementia   . BPH (benign prostatic hypertrophy)   . Colon polyps   . GERD (gastroesophageal reflux disease)   . Neuropathy due to drug [facility])    chemo for stomach cancer  . Osteoarthrosis involving, or with mention of more than one site, but not specified as generalized, multiple sites   . SCC (squamous cell carcinoma), face 2014   left preauricular---[facility]  . Stomach cancer ([place]) 1999   MALT lymphoma --surgery then chemo. Bad infection after with 1 month hospitalization    Past Surgical History:    Procedure Laterality Date  . CATARACT EXTRACTION, BILATERAL  2002, 2003  . [place]  . PARTIAL GASTRECTOMY  1999   2 procedures for stomach cancer  . ROTATOR CUFF REPAIR  1991/1998   left and right done  . TENDON RELEASE Left 6/15   Dr Jefm Bryant    Family History  Problem Relation Age of Onset  . Cancer Brother   . Heart disease Neg Hx   . Hypertension Neg Hx   . Diabetes Neg Hx     Social History   Social History  . Marital status: Married    Spouse name: N/A  . Number of children: 3  . Years of education: N/A   Occupational History  . 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 Smoking status: Former Smoker    Quit date: 10/15/1950  . Smokeless tobacco: Never Used  . Alcohol use Yes     Comment: rare wine  . Drug use: No  . Sexual activity: No   Other Topics Concern  . Not on file   Social History Narrative   3 children in [place]      Has living will    Wife, then son, June Vacha.,  to be health care POA   Would accept resuscitation attempts   Not sure about tube feeds-- probably doesn't want if cognitively unaware   Review of Systems Several falls about a week ago--needed respite in health care for a couple of days Working on setting up increased help at home Appetite still poor    Objective:   Physical Exam  Constitutional: No distress.  HENT:  Mouth/Throat: Oropharynx is clear and moist. No oropharyngeal exudate.  Mild nasal congestion  Neck: No thyromegaly present.  Pulmonary/Chest: Effort normal.  Slight bronchial sounds right base Slight crackles at left base  Lymphadenopathy:    He has no cervical adenopathy.          Assessment & Plan:

## 2017-01-14 NOTE — Assessment & Plan Note (Addendum)
RLL pneumonia on the CXR Will give rocephin 1gm now and start levaquin Discussed--not contagious Okay to go to [facility] on Wednesday if has enough energy

## 2017-01-14 NOTE — Progress Notes (Signed)
Pre visit review using our clinic review tool, if applicable. No additional management support is needed unless otherwise documented below in the visit note. 

## 2017-01-14 NOTE — Addendum Note (Signed)
Addended by: Magdalen Spatz C on: 01/14/2017 02:10 PM   Modules accepted: Orders

## 2017-01-14 NOTE — Assessment & Plan Note (Signed)
Sick for 3 days Seems mostly like bronchitis but some lung findings CXR

## 2017-01-21 ENCOUNTER — Encounter: Payer: Self-pay | Admitting: Internal Medicine

## 2017-01-21 ENCOUNTER — Ambulatory Visit (INDEPENDENT_AMBULATORY_CARE_PROVIDER_SITE_OTHER): Payer: Medicare Other | Admitting: Internal Medicine

## 2017-01-21 VITALS — BP 104/70 | HR 87 | Temp 97.4°F | Wt 145.0 lb

## 2017-01-21 DIAGNOSIS — G301 Alzheimer's disease with late onset: Secondary | ICD-10-CM

## 2017-01-21 DIAGNOSIS — J181 Lobar pneumonia, unspecified organism: Secondary | ICD-10-CM | POA: Diagnosis not present

## 2017-01-21 DIAGNOSIS — F0280 Dementia in other diseases classified elsewhere without behavioral disturbance: Secondary | ICD-10-CM

## 2017-01-21 DIAGNOSIS — R278 Other lack of coordination: Secondary | ICD-10-CM | POA: Diagnosis not present

## 2017-01-21 NOTE — Progress Notes (Signed)
Pre visit review using our clinic review tool, if applicable. No additional management support is needed unless otherwise documented below in the visit note. 

## 2017-01-21 NOTE — Assessment & Plan Note (Signed)
Recent falls and respite Starting PT now

## 2017-01-21 NOTE — Progress Notes (Signed)
Subjective:    Patient ID: Corey Clark, male    DOB: [birth year], 83 y.o.   MRN: [id number]  HPI Here with wife for follow up of pneumonia  Cough is better No fever No SOB  No problems with the levaquin  Current Outpatient Prescriptions on File Prior to Visit  Medication Sig Dispense Refill  . CARAFATE 1 GM/10ML suspension TAKE 10 MLS (1 GRAM TOTAL) TWO TIMES DAILY 1680 mL 2  . ipratropium (ATROVENT) 0.03 % nasal spray Place 2 sprays into both nostrils 3 (three) times daily.    . Lactulose 20 GM/30ML SOLN Take 30 mLs (20 g total) by mouth daily as needed. 60 mL 0  . levofloxacin (LEVAQUIN) 500 MG tablet Take 1 tablet (500 mg total) by mouth daily. For 3 days---then 1 every other day 7 tablet 0  . memantine (NAMENDA) 5 MG tablet Take 1 tablet (5 mg total) by mouth 2 (two) times daily. 60 tablet 3  . mirtazapine (REMERON) 15 MG tablet Take 1 tablet (15 mg total) by mouth at bedtime. 90 tablet 3  . Multiple Vitamin (MULTIVITAMIN) tablet Take 1 tablet by mouth daily.    . vitamin B-12 (CYANOCOBALAMIN) 500 MCG tablet Take 500 mcg by mouth daily.     No current facility-administered medications on file prior to visit.     Allergies  Allergen Reactions  . Tetracycline Other (See Comments)    Sick to stomach  . Tetracyclines & Related Other (See Comments)    Patient not sure it's been years since he's taken med    Past Medical History:  Diagnosis Date  . Alzheimer's dementia   . BPH (benign prostatic hypertrophy)   . Colon polyps   . GERD (gastroesophageal reflux disease)   . Neuropathy due to drug [facility])    chemo for stomach cancer  . Osteoarthrosis involving, or with mention of more than one site, but not specified as generalized, multiple sites   . SCC (squamous cell carcinoma), face 2014   left preauricular---[facility]  . Stomach cancer ([place]) 1999   MALT lymphoma --surgery then chemo. Bad infection after with 1 month hospitalization    Past Surgical History:    Procedure Laterality Date  . CATARACT EXTRACTION, BILATERAL  2002, 2003  . [place]  . PARTIAL GASTRECTOMY  1999   2 procedures for stomach cancer  . ROTATOR CUFF REPAIR  1991/1998   left and right done  . TENDON RELEASE Left 6/15   Dr Jefm Bryant    Family History  Problem Relation Age of Onset  . Cancer Brother   . Heart disease Neg Hx   . Hypertension Neg Hx   . Diabetes Neg Hx     Social History   Social History  . Marital status: Married    Spouse name: N/A  . Number of children: 3  . Years of education: N/A   Occupational History  . 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 Smoking status: Former Smoker    Quit date: 10/15/1950  . Smokeless tobacco: Never Used  . Alcohol use Yes     Comment: rare wine  . Drug use: No  . Sexual activity: No   Other Topics Concern  . Not on file   Social History Narrative   3 children in [place]      Has living will    Wife, then son, Jonathen Rathman., to be  health care POA   Would accept resuscitation attempts   Not sure about tube feeds-- probably doesn't want if cognitively unaware     Review of Systems Appetite still poor No joint pains No N/V    Objective:   Physical Exam  Constitutional: No distress.  Pulmonary/Chest:  Dullness and decreased breath sounds at left base--but no crackles today  Musculoskeletal:  Trouble even getting up on table--both strength and cognitive          Assessment & Plan:

## 2017-01-21 NOTE — Assessment & Plan Note (Signed)
Some improvement Will finish out the antibiotic Recheck with CXR about 1 month

## 2017-01-21 NOTE — Assessment & Plan Note (Addendum)
Worsening day by day He is refusing [place] now Wife finally getting aides at home--but hasn't started yet Counseled them--may need Memory Care to live

## 2017-01-24 ENCOUNTER — Ambulatory Visit: Payer: Medicare Other

## 2017-01-27 ENCOUNTER — Emergency Department: Payer: Medicare Other

## 2017-01-27 ENCOUNTER — Emergency Department
Admission: EM | Admit: 2017-01-27 | Discharge: 2017-01-27 | Disposition: A | Discharge status: Home / Self Care | Payer: Medicare Other | Attending: Emergency Medicine | Admitting: Emergency Medicine

## 2017-01-27 ENCOUNTER — Encounter: Payer: Self-pay | Admitting: Emergency Medicine

## 2017-01-27 DIAGNOSIS — F0280 Dementia in other diseases classified elsewhere without behavioral disturbance: Secondary | ICD-10-CM | POA: Diagnosis not present

## 2017-01-27 DIAGNOSIS — Z79899 Other long term (current) drug therapy: Secondary | ICD-10-CM | POA: Diagnosis not present

## 2017-01-27 DIAGNOSIS — Y929 Unspecified place or not applicable: Secondary | ICD-10-CM | POA: Diagnosis not present

## 2017-01-27 DIAGNOSIS — W0110XA Fall on same level from slipping, tripping and stumbling with subsequent striking against unspecified object, initial encounter: Secondary | ICD-10-CM | POA: Diagnosis not present

## 2017-01-27 DIAGNOSIS — S0101XA Laceration without foreign body of scalp, initial encounter: Secondary | ICD-10-CM | POA: Diagnosis not present

## 2017-01-27 DIAGNOSIS — G309 Alzheimer's disease, unspecified: Secondary | ICD-10-CM | POA: Insufficient documentation

## 2017-01-27 DIAGNOSIS — S50312A Abrasion of left elbow, initial encounter: Secondary | ICD-10-CM | POA: Insufficient documentation

## 2017-01-27 DIAGNOSIS — Z87891 Personal history of nicotine dependence: Secondary | ICD-10-CM | POA: Insufficient documentation

## 2017-01-27 DIAGNOSIS — Z85028 Personal history of other malignant neoplasm of stomach: Secondary | ICD-10-CM | POA: Diagnosis not present

## 2017-01-27 DIAGNOSIS — S0990XA Unspecified injury of head, initial encounter: Secondary | ICD-10-CM

## 2017-01-27 DIAGNOSIS — Y9389 Activity, other specified: Secondary | ICD-10-CM | POA: Diagnosis not present

## 2017-01-27 DIAGNOSIS — S20419A Abrasion of unspecified back wall of thorax, initial encounter: Secondary | ICD-10-CM | POA: Diagnosis not present

## 2017-01-27 DIAGNOSIS — Y999 Unspecified external cause status: Secondary | ICD-10-CM | POA: Diagnosis not present

## 2017-01-27 DIAGNOSIS — W19XXXA Unspecified fall, initial encounter: Secondary | ICD-10-CM

## 2017-01-27 DIAGNOSIS — S0003XA Contusion of scalp, initial encounter: Secondary | ICD-10-CM

## 2017-01-27 LAB — URINALYSIS, COMPLETE (UACMP) WITH MICROSCOPIC
Bacteria, UA: NONE SEEN
Bilirubin Urine: NEGATIVE
GLUCOSE, UA: NEGATIVE mg/dL
Hgb urine dipstick: NEGATIVE
Ketones, ur: NEGATIVE mg/dL
Leukocytes, UA: NEGATIVE
Nitrite: NEGATIVE
PROTEIN: NEGATIVE mg/dL
RBC / HPF: NONE SEEN RBC/hpf (ref 0–5)
Specific Gravity, Urine: 1.005 (ref 1.005–1.030)
pH: 6 (ref 5.0–8.0)

## 2017-01-27 LAB — CBC
HCT: 37.3 % — ABNORMAL LOW (ref 40.0–52.0)
Hemoglobin: 12.8 g/dL — ABNORMAL LOW (ref 13.0–18.0)
MCH: 31.1 pg (ref 26.0–34.0)
MCHC: 34.2 g/dL (ref 32.0–36.0)
MCV: 90.7 fL (ref 80.0–100.0)
Platelets: 179 10*3/uL (ref 150–440)
RBC: 4.11 MIL/uL — ABNORMAL LOW (ref 4.40–5.90)
RDW: 14.1 % (ref 11.5–14.5)
WBC: 3.9 10*3/uL (ref 3.8–10.6)

## 2017-01-27 LAB — BASIC METABOLIC PANEL
Anion gap: 4 — ABNORMAL LOW (ref 5–15)
BUN: 13 mg/dL (ref 6–20)
CALCIUM: 8.5 mg/dL — AB (ref 8.9–10.3)
CO2: 30 mmol/L (ref 22–32)
CREATININE: 1.02 mg/dL (ref 0.61–1.24)
Chloride: 101 mmol/L (ref 101–111)
GFR calc non Af Amer: >60 mL/min (ref >60)
Glucose, Bld: 85 mg/dL (ref 65–99)
Potassium: 3.9 mmol/L (ref 3.5–5.1)
SODIUM: 135 mmol/L (ref 135–145)

## 2017-01-27 LAB — TROPONIN I

## 2017-01-27 MED ORDER — BACITRACIN ZINC 500 UNIT/GM EX OINT
TOPICAL_OINTMENT | Freq: Once | CUTANEOUS | Status: AC
  Administered 2017-01-27: 1 via TOPICAL

## 2017-01-27 MED ORDER — BACITRACIN ZINC 500 UNIT/GM EX OINT
TOPICAL_OINTMENT | CUTANEOUS | Status: AC
  Administered 2017-01-27: 1 via TOPICAL
  Filled 2017-01-27: qty 0.9

## 2017-01-27 NOTE — ED Triage Notes (Signed)
Pt to rm 26 via EMS from home for mechanical fall, laceration to back of head, denies LOC, denies dizziness.  Laceration to right elbow as well.  Pt reports pain to left hip, but ambulatory at the scene.  Pt from independent living at [facility].  VSS en route.  Pt NAD upon arrival.

## 2017-01-27 NOTE — ED Provider Notes (Signed)
[facility] Emergency Department Provider Note   ____________________________________________   First MD Initiated Contact with Patient 01/27/17 [phone]     (approximate)  I have reviewed the triage vital signs and the nursing notes.   HISTORY  Chief Complaint Fall    HPI Corey Clark is a 83 y.o. male who comes into the hospital tonight after a fall. The patient reports that he was getting up to go to the bathroom and on the way down to the bathroom he slipped and fell. He reports that 1 foot slid out from the other. He said it was on hardwood floors. The patient hit his head he reports that it seemed to be in 3 places but he is unsure exactly how that occurred. The patient denies any loss of consciousness. He reports that he fell yesterday as well and he is unable to explain that. He reports that he's felt for the past 3 days although he typically doesn't fall. He reports that he has some discomfort under his head but has no other complaints. EMS states that the patient did have some hip pain but that resolved and he was walking without difficulty. The patient is here today for evaluation.   Past Medical History:  Diagnosis Date  . Alzheimer's dementia   . BPH (benign prostatic hypertrophy)   . Colon polyps   . GERD (gastroesophageal reflux disease)   . Neuropathy due to drug [facility])    chemo for stomach cancer  . Osteoarthrosis involving, or with mention of more than one site, but not specified as generalized, multiple sites   . SCC (squamous cell carcinoma), face 2014   left preauricular---[facility]  . Stomach cancer ([place]) 1999   MALT lymphoma --surgery then chemo. Bad infection after with 1 month hospitalization    Patient Active Problem List   Diagnosis Date Noted  . Lobar pneumonia ([place]) 01/14/2017  . Mood disorder ([place]) 10/23/2016  . Mild malnutrition ([place]) 07/27/2016  . Alzheimer's dementia, late onset 08/31/2015  . Sensory ataxia  08/31/2015  . Cough 07/25/2015  . Advance directive discussed with patient 01/17/2015  . Non-allergic rhinitis 06/29/2014  . Abnormality of lung on CXR 06/29/2014  . Trigger finger 04/07/2014  . [facility] DJD(carpometacarpal degenerative joint disease), localized primary 07/13/2013  . H/O malignant neoplasm of skin 06/29/2013  . BPH (benign prostatic hypertrophy)   . Routine general medical examination at a health care facility 05/30/2012  . Other constipation 11/26/2011  . GERD (gastroesophageal reflux disease)   . Osteoarthritis, multiple sites   . Neuropathy due to drug [facility])     Past Surgical History:  Procedure Laterality Date  . CATARACT EXTRACTION, BILATERAL  2002, 2003  . [place]  . PARTIAL GASTRECTOMY  1999   2 procedures for stomach cancer  . ROTATOR CUFF REPAIR  1991/1998   left and right done  . TENDON RELEASE Left 6/15   Dr Jefm Bryant    Prior to Admission medications   Medication Sig Start Date End Date Taking? Authorizing Provider  CARAFATE 1 GM/10ML suspension TAKE 10 MLS (1 GRAM TOTAL) TWO TIMES DAILY 01/14/17   Venia Carbon, MD  ipratropium (ATROVENT) 0.03 % nasal spray Place 2 sprays into both nostrils 3 (three) times daily.    Historical Provider, MD  Lactulose 20 GM/30ML SOLN Take 30 mLs (20 g total) by mouth daily as needed. 08/16/16   Anne-Caroline Mariea Clonts, MD  levofloxacin (LEVAQUIN) 500 MG tablet Take 1 tablet (500  mg total) by mouth daily. For 3 days---then 1 every other day 01/14/17   Venia Carbon, MD  memantine (NAMENDA) 5 MG tablet Take 1 tablet (5 mg total) by mouth 2 (two) times daily. 12/11/16   Venia Carbon, MD  mirtazapine (REMERON) 15 MG tablet Take 1 tablet (15 mg total) by mouth at bedtime. 01/01/17   Venia Carbon, MD  Multiple Vitamin (MULTIVITAMIN) tablet Take 1 tablet by mouth daily.    Historical Provider, MD  vitamin B-12 (CYANOCOBALAMIN) 500 MCG tablet Take 500 mcg by mouth daily.    Historical Provider, MD     Allergies Tetracycline and Tetracyclines & related  Family History  Problem Relation Age of Onset  . Cancer Brother   . Heart disease Neg Hx   . Hypertension Neg Hx   . Diabetes Neg Hx     Social History Social History  Substance Use Topics  . Smoking status: Former Smoker    Quit date: 10/15/1950  . Smokeless tobacco: Never Used  . Alcohol use Yes     Comment: rare wine    Review of Systems Constitutional: No fever/chills Eyes: No visual changes. ENT: No sore throat. Cardiovascular: Denies chest pain. Respiratory: Denies shortness of breath. Gastrointestinal: No abdominal pain.  No nausea, no vomiting.  No diarrhea.  No constipation. Genitourinary: Negative for dysuria. Musculoskeletal: Negative for back pain. Skin: Negative for rash. Neurological: Negative for headaches, focal weakness or numbness.  10-point ROS otherwise negative.  ____________________________________________   PHYSICAL EXAM:  VITAL SIGNS: ED Triage Vitals  Enc Vitals Group     BP 01/27/17 0421 103/66     Pulse Rate 01/27/17 0421 80     Resp 01/27/17 0421 18     Temp 01/27/17 0421 97.7 F (36.5 C)     Temp Source 01/27/17 0421 Oral     SpO2 01/27/17 0421 97 %     Weight 01/27/17 0418 153 lb 7 oz (69.6 kg)     Height 01/27/17 0418 5\' 10"  (1.778 m)     Head Circumference --      Peak Flow --      Pain Score 01/27/17 0417 3     Pain Loc --      Pain Edu? --      Excl. in [place]? --     Constitutional: Alert and oriented. Well appearing and in no acute distress. Eyes: Conjunctivae are normal. PERRL. EOMI. Head: Contusion to forehead with contusion and laceration to left occipital parietal area. Nose: No congestion/rhinnorhea. Mouth/Throat: Mucous membranes are moist.  Oropharynx non-erythematous. Neck: No cervical spine tenderness to palpation. Cardiovascular: Normal rate, regular rhythm. Grossly normal heart sounds.  Good peripheral circulation. Respiratory: Normal respiratory  effort.  No retractions. Lungs CTAB. Gastrointestinal: Soft and nontender. No distention. Positive bowel sounds Musculoskeletal: No lower extremity tenderness nor edema.  Neurologic:  Normal speech and language. Skin:  Skin is warm, dry Abrasion to left elbow, multiple abrasions to back. Psychiatric: Mood and affect are normal.   ____________________________________________   LABS (all labs ordered are listed, but only abnormal results are displayed)  Labs Reviewed  CBC - Abnormal; Notable for the following:       Result Value   RBC 4.11 (*)    Hemoglobin 12.8 (*)    HCT 37.3 (*)    All other components within normal limits  BASIC METABOLIC PANEL - Abnormal; Notable for the following:    Calcium 8.5 (*)    Anion gap 4 (*)  All other components within normal limits  URINALYSIS, COMPLETE (UACMP) WITH MICROSCOPIC - Abnormal; Notable for the following:    Color, Urine YELLOW (*)    APPearance CLEAR (*)    Squamous Epithelial / LPF 0-5 (*)    All other components within normal limits  TROPONIN I   ____________________________________________  EKG  ED ECG REPORT I, Loney Hering, the attending physician, personally viewed and interpreted this ECG.   Date: 01/27/2017  EKG Time: 428  Rate: 78  Rhythm: normal sinus rhythm  Axis: normal  Intervals:none  ST&T Change: none  ____________________________________________  RADIOLOGY  CT head and cervical spine ____________________________________________   PROCEDURES  Procedure(s) performed: None  Procedures  Critical Care performed: No  ____________________________________________   INITIAL IMPRESSION / ASSESSMENT AND PLAN / ED COURSE  Pertinent labs & imaging results that were available during my care of the patient were reviewed by me and considered in my medical decision making (see chart for details).  This is an 83 year old male who comes into the hospital today after a fall. The patient reports that  he slipped. We did check some blood work because he's fallen multiple times in the past few days. The patient has a contusion to his head on the front as well as something on the left parieto-occipital area. I did clean the wound but it does not open and it does not appear to need to be sutured. Since the patient's blood work and imaging studies are unremarkable I will discharge him to home to have him follow-up with his primary care physician.  Clinical Course as of Jan 28 611  Sun Jan 27, 2017  0600 1. Mild cerebral atrophy and chronic microvascular ischemia without acute intracranial abnormality. 2. Discontinuity of the anterior C4-C5 osteophyte is favored to be nonacute, given the lack of widening of the disc space or facet joint and the lack of surrounding soft tissue abnormality. 3. No other evidence of cervical spine fracture. No static subluxation. 4. Multilevel severe facet arthrosis resulting and severe neural foraminal narrowing at right C2-3, C3-4 and C5-6. 5. Flowing anterior osteophytes of the cervical spine, consistent with diffuse idiopathic skeletal hyperostosis.   CT Head Wo Contrast [AW]    Clinical Course User Index [AW] Loney Hering, MD     ____________________________________________   FINAL CLINICAL IMPRESSION(S) / ED DIAGNOSES  Final diagnoses:  Fall, initial encounter  Injury of head, initial encounter  Contusion of scalp, initial encounter      NEW MEDICATIONS STARTED DURING THIS VISIT:  New Prescriptions   No medications on file     Note:  This document was prepared using Dragon voice recognition software and may include unintentional dictation errors.    Loney Hering, MD 01/27/17 [phone]

## 2017-02-01 ENCOUNTER — Encounter: Payer: Self-pay | Admitting: Internal Medicine

## 2017-02-07 DIAGNOSIS — N400 Enlarged prostate without lower urinary tract symptoms: Secondary | ICD-10-CM

## 2017-02-07 DIAGNOSIS — G301 Alzheimer's disease with late onset: Secondary | ICD-10-CM | POA: Diagnosis not present

## 2017-02-07 DIAGNOSIS — R278 Other lack of coordination: Secondary | ICD-10-CM

## 2017-02-07 DIAGNOSIS — K219 Gastro-esophageal reflux disease without esophagitis: Secondary | ICD-10-CM | POA: Diagnosis not present

## 2017-02-20 DIAGNOSIS — N401 Enlarged prostate with lower urinary tract symptoms: Secondary | ICD-10-CM | POA: Diagnosis not present

## 2017-02-20 DIAGNOSIS — G309 Alzheimer's disease, unspecified: Secondary | ICD-10-CM | POA: Diagnosis not present

## 2017-02-20 DIAGNOSIS — K219 Gastro-esophageal reflux disease without esophagitis: Secondary | ICD-10-CM | POA: Diagnosis not present

## 2017-02-20 DIAGNOSIS — R270 Ataxia, unspecified: Secondary | ICD-10-CM | POA: Diagnosis not present

## 2017-02-25 ENCOUNTER — Ambulatory Visit: Payer: Medicare Other | Admitting: Internal Medicine

## 2017-02-28 ENCOUNTER — Ambulatory Visit: Payer: Medicare Other | Admitting: Internal Medicine

## 2017-03-14 ENCOUNTER — Encounter: Payer: Self-pay | Admitting: Podiatry

## 2017-03-14 ENCOUNTER — Ambulatory Visit (INDEPENDENT_AMBULATORY_CARE_PROVIDER_SITE_OTHER): Payer: Medicare Other | Admitting: Podiatry

## 2017-03-14 DIAGNOSIS — B351 Tinea unguium: Secondary | ICD-10-CM

## 2017-03-14 DIAGNOSIS — M79676 Pain in unspecified toe(s): Secondary | ICD-10-CM

## 2017-03-14 DIAGNOSIS — G620 Drug-induced polyneuropathy: Secondary | ICD-10-CM

## 2017-03-14 NOTE — Progress Notes (Signed)
Complaint:  Visit Type: Patient returns to my office for continued preventative foot care services. Complaint: Patient states" my nails have grown long and thick and become painful to walk and wear shoes" . The patient presents for preventative foot care services. No changes to ROS  Podiatric Exam: Vascular: dorsalis pedis and posterior tibial pulses are palpable bilateral. Capillary return is immediate. Temperature gradient is WNL. Skin turgor WNL  Sensorium: Normal Semmes Weinstein monofilament test. Normal tactile sensation bilaterally. Nail Exam: Pt has thick disfigured discolored nails with subungual debris noted bilateral entire nail hallux through fifth toenails Ulcer Exam: There is no evidence of ulcer or pre-ulcerative changes or infection. Orthopedic Exam: Muscle tone and strength are WNL. No limitations in general ROM. No crepitus or effusions noted. Foot type and digits show no abnormalities. Bony prominences are unremarkable. Skin: No Porokeratosis. No infection or ulcers  Diagnosis:  Onychomycosis, , Pain in right toe, pain in left toes  Treatment & Plan Procedures and Treatment: Consent by patient was obtained for treatment procedures. The patient understood the discussion of treatment and procedures well. All questions were answered thoroughly reviewed. Debridement of mycotic and hypertrophic toenails, 1 through 5 bilateral and clearing of subungual debris. No ulceration, no infection noted.  Return Visit-Office Procedure: Patient instructed to return to the office for a follow up visit 3 months   for continued evaluation and treatment.    Dameon Soltis DPM 

## 2017-04-03 DIAGNOSIS — R0602 Shortness of breath: Secondary | ICD-10-CM | POA: Diagnosis not present

## 2017-04-08 DIAGNOSIS — N400 Enlarged prostate without lower urinary tract symptoms: Secondary | ICD-10-CM | POA: Diagnosis not present

## 2017-04-12 DIAGNOSIS — J189 Pneumonia, unspecified organism: Secondary | ICD-10-CM | POA: Diagnosis not present

## 2017-04-12 DIAGNOSIS — R0602 Shortness of breath: Secondary | ICD-10-CM | POA: Diagnosis not present

## 2017-05-04 ENCOUNTER — Emergency Department: Payer: Medicare Other

## 2017-05-04 ENCOUNTER — Encounter: Payer: Self-pay | Admitting: Emergency Medicine

## 2017-05-04 ENCOUNTER — Inpatient Hospital Stay
Admission: EM | Admit: 2017-05-04 | Discharge: 2017-05-15 | DRG: 193 | Disposition: E | Payer: Medicare Other | Attending: Internal Medicine | Admitting: Internal Medicine

## 2017-05-04 DIAGNOSIS — I5031 Acute diastolic (congestive) heart failure: Secondary | ICD-10-CM | POA: Diagnosis present

## 2017-05-04 DIAGNOSIS — K219 Gastro-esophageal reflux disease without esophagitis: Secondary | ICD-10-CM | POA: Diagnosis present

## 2017-05-04 DIAGNOSIS — Z7189 Other specified counseling: Secondary | ICD-10-CM

## 2017-05-04 DIAGNOSIS — R0602 Shortness of breath: Secondary | ICD-10-CM

## 2017-05-04 DIAGNOSIS — N179 Acute kidney failure, unspecified: Secondary | ICD-10-CM | POA: Diagnosis present

## 2017-05-04 DIAGNOSIS — Z79899 Other long term (current) drug therapy: Secondary | ICD-10-CM

## 2017-05-04 DIAGNOSIS — Z515 Encounter for palliative care: Secondary | ICD-10-CM

## 2017-05-04 DIAGNOSIS — J189 Pneumonia, unspecified organism: Principal | ICD-10-CM | POA: Diagnosis present

## 2017-05-04 DIAGNOSIS — Z66 Do not resuscitate: Secondary | ICD-10-CM | POA: Diagnosis present

## 2017-05-04 DIAGNOSIS — Z87891 Personal history of nicotine dependence: Secondary | ICD-10-CM

## 2017-05-04 DIAGNOSIS — L89151 Pressure ulcer of sacral region, stage 1: Secondary | ICD-10-CM | POA: Diagnosis present

## 2017-05-04 DIAGNOSIS — J188 Other pneumonia, unspecified organism: Secondary | ICD-10-CM

## 2017-05-04 DIAGNOSIS — Y95 Nosocomial condition: Secondary | ICD-10-CM | POA: Diagnosis present

## 2017-05-04 DIAGNOSIS — R1310 Dysphagia, unspecified: Secondary | ICD-10-CM | POA: Diagnosis present

## 2017-05-04 DIAGNOSIS — E860 Dehydration: Secondary | ICD-10-CM | POA: Diagnosis present

## 2017-05-04 DIAGNOSIS — N289 Disorder of kidney and ureter, unspecified: Secondary | ICD-10-CM | POA: Diagnosis present

## 2017-05-04 DIAGNOSIS — Z8701 Personal history of pneumonia (recurrent): Secondary | ICD-10-CM

## 2017-05-04 DIAGNOSIS — R64 Cachexia: Secondary | ICD-10-CM | POA: Diagnosis not present

## 2017-05-04 DIAGNOSIS — Z888 Allergy status to other drugs, medicaments and biological substances status: Secondary | ICD-10-CM

## 2017-05-04 DIAGNOSIS — F0280 Dementia in other diseases classified elsewhere without behavioral disturbance: Secondary | ICD-10-CM | POA: Diagnosis present

## 2017-05-04 DIAGNOSIS — J90 Pleural effusion, not elsewhere classified: Secondary | ICD-10-CM

## 2017-05-04 DIAGNOSIS — I248 Other forms of acute ischemic heart disease: Secondary | ICD-10-CM | POA: Diagnosis present

## 2017-05-04 DIAGNOSIS — I959 Hypotension, unspecified: Secondary | ICD-10-CM | POA: Diagnosis not present

## 2017-05-04 DIAGNOSIS — Z86010 Personal history of colonic polyps: Secondary | ICD-10-CM

## 2017-05-04 DIAGNOSIS — L8990 Pressure ulcer of unspecified site, unspecified stage: Secondary | ICD-10-CM | POA: Insufficient documentation

## 2017-05-04 DIAGNOSIS — J9601 Acute respiratory failure with hypoxia: Secondary | ICD-10-CM | POA: Diagnosis present

## 2017-05-04 DIAGNOSIS — Z85028 Personal history of other malignant neoplasm of stomach: Secondary | ICD-10-CM

## 2017-05-04 DIAGNOSIS — G309 Alzheimer's disease, unspecified: Secondary | ICD-10-CM | POA: Diagnosis present

## 2017-05-04 LAB — URINALYSIS, COMPLETE (UACMP) WITH MICROSCOPIC
BILIRUBIN URINE: NEGATIVE
Glucose, UA: NEGATIVE mg/dL
HGB URINE DIPSTICK: NEGATIVE
KETONES UR: NEGATIVE mg/dL
LEUKOCYTES UA: NEGATIVE
NITRITE: NEGATIVE
PH: 5 (ref 5.0–8.0)
Protein, ur: 30 mg/dL — AB
RBC / HPF: NONE SEEN RBC/hpf (ref 0–5)
Specific Gravity, Urine: 1.02 (ref 1.005–1.030)

## 2017-05-04 LAB — CBC WITH DIFFERENTIAL/PLATELET
Basophils Absolute: 0 10*3/uL (ref 0–0.1)
Basophils Relative: 0 %
Eosinophils Absolute: 0 10*3/uL (ref 0–0.7)
Eosinophils Relative: 1 %
HEMATOCRIT: 35.4 % — AB (ref 40.0–52.0)
Hemoglobin: 11.9 g/dL — ABNORMAL LOW (ref 13.0–18.0)
LYMPHS ABS: 0.5 10*3/uL — AB (ref 1.0–3.6)
LYMPHS PCT: 8 %
MCH: 30.5 pg (ref 26.0–34.0)
MCHC: 33.5 g/dL (ref 32.0–36.0)
MCV: 91 fL (ref 80.0–100.0)
MONO ABS: 0.6 10*3/uL (ref 0.2–1.0)
MONOS PCT: 9 %
NEUTROS ABS: 5.3 10*3/uL (ref 1.4–6.5)
Neutrophils Relative %: 82 %
Platelets: 132 10*3/uL — ABNORMAL LOW (ref 150–440)
RBC: 3.9 MIL/uL — ABNORMAL LOW (ref 4.40–5.90)
RDW: 14.5 % (ref 11.5–14.5)
WBC: 6.5 10*3/uL (ref 3.8–10.6)

## 2017-05-04 LAB — COMPREHENSIVE METABOLIC PANEL
ALK PHOS: 118 U/L (ref 38–126)
ALT: 24 U/L (ref 17–63)
AST: 52 U/L — AB (ref 15–41)
Albumin: 2.2 g/dL — ABNORMAL LOW (ref 3.5–5.0)
Anion gap: 7 (ref 5–15)
BUN: 26 mg/dL — AB (ref 6–20)
CHLORIDE: 103 mmol/L (ref 101–111)
CO2: 30 mmol/L (ref 22–32)
CREATININE: 1.01 mg/dL (ref 0.61–1.24)
Calcium: 8.3 mg/dL — ABNORMAL LOW (ref 8.9–10.3)
GFR calc Af Amer: >60 mL/min (ref >60)
Glucose, Bld: 116 mg/dL — ABNORMAL HIGH (ref 65–99)
Potassium: 3.5 mmol/L (ref 3.5–5.1)
Sodium: 140 mmol/L (ref 135–145)
Total Bilirubin: 1 mg/dL (ref 0.3–1.2)
Total Protein: 7.4 g/dL (ref 6.5–8.1)

## 2017-05-04 LAB — BRAIN NATRIURETIC PEPTIDE: B Natriuretic Peptide: 180 pg/mL — ABNORMAL HIGH (ref 0.0–100.0)

## 2017-05-04 LAB — TROPONIN I: Troponin I: 0.03 ng/mL (ref <0.03)

## 2017-05-04 LAB — LACTIC ACID, PLASMA: Lactic Acid, Venous: 1.1 mmol/L (ref 0.5–1.9)

## 2017-05-04 MED ORDER — SODIUM CHLORIDE 0.9 % IV BOLUS (SEPSIS)
500.0000 mL | Freq: Once | INTRAVENOUS | Status: AC
  Administered 2017-05-04: 500 mL via INTRAVENOUS

## 2017-05-04 MED ORDER — IOPAMIDOL (ISOVUE-300) INJECTION 61%
75.0000 mL | Freq: Once | INTRAVENOUS | Status: AC | PRN
  Administered 2017-05-04: 75 mL via INTRAVENOUS

## 2017-05-04 NOTE — ED Triage Notes (Signed)
Per ems pt just getting over pneumonia, per wife and nh notes pt has been off antibiotics for 3 weeks. Pt hypotensive, wet cough, less aware per nh (in the memory care unit at [facility])

## 2017-05-04 NOTE — ED Provider Notes (Addendum)
[facility] Emergency Department Provider Note  ____________________________________________   I have reviewed the triage vital signs and the nursing notes.   HISTORY  Chief Complaint Cough    HPI Corey Clark is a 83 y.o. male who has Alzheimer's dementia, DO NOT RESUSCITATE status, history of recent pneumonia, who has baseline low blood pressures in the low 90s according to notes going back several weeks at the nursing home, also when necessary oxygen for low sats which is when he is now on again over the last few weeks since his discharge here. He is no longer on antibiotics. The patient has had increased cough according to his wife and seems less awake and alert over the last couple days. This evening he had a temp of 100.1, he was given Tylenol and it was noted also that his oxygen saturation was somewhat lower than normal 80s he was put on oxygen and sent in for further evaluation. The patient cannot give any history, he is at his baseline quite demented. He can't tell me where he grew out and his father did for a living however.Level 5 chart caveat; no further history available due to patient status.     Past Medical History:  Diagnosis Date  . Alzheimer's dementia   . BPH (benign prostatic hypertrophy)   . Colon polyps   . GERD (gastroesophageal reflux disease)   . 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 Osteoarthrosis involving, or with mention of more than one site, but not specified as generalized, multiple sites   . SCC (squamous cell carcinoma), face 2014   left preauricular---[facility]  . Stomach cancer ([place]) 1999   MALT lymphoma --surgery then chemo. Bad infection after with 1 month hospitalization    Patient Active Problem List   Diagnosis Date Noted  . Lobar pneumonia ([place]) 01/14/2017  . Mood disorder ([place]) 10/23/2016  . Mild malnutrition ([place]) 07/27/2016  . Alzheimer's dementia, late onset 08/31/2015  . Sensory  ataxia 08/31/2015  . Cough 07/25/2015  . Advance directive discussed with patient 01/17/2015  . Non-allergic rhinitis 06/29/2014  . Abnormality of lung on CXR 06/29/2014  . Trigger finger 04/07/2014  . [facility] DJD(carpometacarpal degenerative joint disease), localized primary 07/13/2013  . H/O malignant neoplasm of skin 06/29/2013  . BPH (benign prostatic hypertrophy)   . Routine general medical examination at a health care facility 05/30/2012  . Other constipation 11/26/2011  . GERD (gastroesophageal reflux disease)   . Osteoarthritis, multiple sites   . Neuropathy due to drug [facility])     Past Surgical History:  Procedure Laterality Date  . CATARACT EXTRACTION, BILATERAL  2002, 2003  . [place]  . PARTIAL GASTRECTOMY  1999   2 procedures for stomach cancer  . ROTATOR CUFF REPAIR  1991/1998   left and right done  . TENDON RELEASE Left 6/15   Dr Jefm Bryant    Prior to Admission medications   Medication Sig Start Date End Date Taking? Authorizing Provider  CARAFATE 1 GM/10ML suspension TAKE 10 MLS (1 GRAM TOTAL) TWO TIMES DAILY 01/14/17   Viviana Simpler I, MD  ipratropium (ATROVENT) 0.03 % nasal spray Place 2 sprays into both nostrils 3 (three) times daily.    [provider]  Lactulose 20 GM/30ML SOLN Take 30 mLs (20 g total) by mouth daily as needed. 08/16/16   Eula Listen, MD  levofloxacin (LEVAQUIN) 500 MG tablet Take 1 tablet (500 mg total) by mouth  daily. For 3 days---then 1 every other day 01/14/17   Venia Carbon, MD  memantine (NAMENDA) 5 MG tablet Take 1 tablet (5 mg total) by mouth 2 (two) times daily. 12/11/16   Venia Carbon, MD  mirtazapine (REMERON) 15 MG tablet Take 1 tablet (15 mg total) by mouth at bedtime. 01/01/17   Venia Carbon, MD  Multiple Vitamin (MULTIVITAMIN) tablet Take 1 tablet by mouth daily.    [provider]  vitamin B-12 (CYANOCOBALAMIN) 500 MCG tablet Take 500 mcg by mouth daily.    [provider]    Allergies Tetracycline and Tetracyclines & related  Family History  Problem Relation Age of Onset  . Cancer Brother   . Heart disease Neg Hx   . Hypertension Neg Hx   . Diabetes Neg Hx     Social History Social History  Substance Use Topics  . Smoking status: Former Smoker    Quit date: 10/15/1950  . Smokeless tobacco: Never Used  . Alcohol use Yes     Comment: rare wine    Review of Systems Level 5 chart caveat; no further history available due to patient status.   ____________________________________________   PHYSICAL EXAM:  VITAL SIGNS: ED Triage Vitals  Enc Vitals Group     BP 04/17/2017 2057 92/64     Pulse Rate 04/23/2017 1945 92     Resp 04/15/2017 1945 20     Temp 05/11/2017 1945 98.1 F (36.7 C)     Temp src --      SpO2 04/25/2017 1945 100 %     Weight 05/13/2017 1947 141 lb 1.5 oz (64 kg)     Height 05/13/2017 1947 5\' 10"  (1.778 m)     Head Circumference --      Peak Flow --      Pain Score 05/10/2017 2251 Asleep     Pain Loc --      Pain Edu? --      Excl. in [place]? --     Constitutional: Alert and oriented . Well appearing and in no acute distress. Eyes: Conjunctivae are normal Head: Atraumatic HEENT: No congestion/rhinnorhea. Mucous membranes are moist.  Oropharynx non-erythematous Neck:   Nontender with no meningismus, no masses, no stridor Cardiovascular: Normal rate, regular rhythm. Grossly normal heart sounds.  Good peripheral circulation. Respiratory: Normal respiratory effort.  No retractions. Lungs CTAB. Abdominal: Soft and nontender. No distention. No guarding no rebound Back:  There is no focal tenderness or step off.  there is no midline tenderness there are no lesions noted. there is no CVA tenderness Musculoskeletal: No lower extremity tenderness, no upper extremity tenderness. No joint effusions, no DVT signs strong distal pulses no edema Neurologic:  Normal speech and language. No gross focal neurologic deficits are appreciated.   Skin:  Skin is warm, dry and intact. No rash noted. Psychiatric: Mood and affect are normal. Speech and behavior are normal.  ____________________________________________   LABS (all labs ordered are listed, but only abnormal results are displayed)  Labs Reviewed  COMPREHENSIVE METABOLIC PANEL - Abnormal; Notable for the following:       Result Value   Glucose, Bld 116 (*)    BUN 26 (*)    Calcium 8.3 (*)    Albumin 2.2 (*)    AST 52 (*)    All other components within normal limits  CBC WITH DIFFERENTIAL/PLATELET - Abnormal; Notable for the following:    RBC 3.90 (*)    Hemoglobin 11.9 (*)  HCT 35.4 (*)    Platelets 132 (*)    Lymphs Abs 0.5 (*)    All other components within normal limits  BRAIN NATRIURETIC PEPTIDE - Abnormal; Notable for the following:    B Natriuretic Peptide 180.0 (*)    All other components within normal limits  TROPONIN I - Abnormal; Notable for the following:    Troponin I 0.03 (*)    All other components within normal limits  URINALYSIS, COMPLETE (UACMP) WITH MICROSCOPIC - Abnormal; Notable for the following:    Color, Urine AMBER (*)    APPearance CLOUDY (*)    Protein, ur 30 (*)    Bacteria, UA RARE (*)    Squamous Epithelial / LPF 0-5 (*)    All other components within normal limits  CULTURE, BLOOD (ROUTINE X 2)  CULTURE, BLOOD (ROUTINE X 2)  URINE CULTURE  LACTIC ACID, PLASMA  LACTIC ACID, PLASMA   ____________________________________________  EKG  I personally interpreted any EKGs ordered by me or triage Sinus rhythm rate 87 bpm no acute ST elevation or acute ST depression normal axis. ____________________________________________  [id number]  I reviewed any imaging ordered by me or triage that were performed during my shift and, if possible, patient and/or family made aware of any abnormal findings. ____________________________________________   PROCEDURES  Procedure(s) performed: None  Procedures  Critical Care  performed: None  ____________________________________________   INITIAL IMPRESSION / ASSESSMENT AND PLAN / ED COURSE  Pertinent labs & imaging results that were available during my care of the patient were reviewed by me and considered in my medical decision making (see chart for details).   Patient here with what appeared to be mostly chronic issues, he has when necessary oxygen requirements which she is having today, he has chronic effusions which he is having today, no evidence of clear pneumonia and no evidence of elevated white count, may be slightly dehydrated blood pressure is in the mid 90s which is his baseline. He himself has no complaints and is at his baseline according to his wife. We have given him IV fluid as his BUN is mildly elevated but his creatinine remains at baseline. No evidence of occult ischemia. Chest x-ray appreciated but I don't see any clear evidence for the final definitive pneumonia. Given low-grade near fever I did do a urine which is reassuring. Family feels she would do better back at the nursing home given his dementia is a feel that that is a more controlled environment for him. We have sent blood cultures which could be of some utility although recently doesn't look septic with a normal lactic acid etc. I feel therefore that the patient likely would benefit from discharge. He is DO NOT RESUSCITATE the family does not wish heroic measures were to they wish to have him admitted if at all possible to send him home. Looking back at his blood pressures, he rarely is at 100 he is almost always between 90 and 100 for the last 2 weeks. This is not unusual for him. He is not tachycardic, he is not febrile here, he has no evidence of respiratory distress etc. I am not convinced this is a pneumonia I am concerned about cancer, we will obtain a CT or to discharge. Signed out to Dr. Owens Shark at the end of my shift.   ____________________________________________   FINAL CLINICAL  IMPRESSION(S) / ED DIAGNOSES  Final diagnoses:  None      This chart was dictated using voice recognition software.  Despite best  efforts to proofread,  errors can occur which can change meaning.      Schuyler Amor, MD 04/21/2017 2316    Schuyler Amor, MD 04/19/2017 8381    Schuyler Amor, MD 04/21/2017 [phone]

## 2017-05-05 ENCOUNTER — Inpatient Hospital Stay (HOSPITAL_COMMUNITY)
Admit: 2017-05-05 | Discharge: 2017-05-05 | Disposition: A | Discharge status: Home / Self Care | Payer: Medicare Other | Attending: Internal Medicine | Admitting: Internal Medicine

## 2017-05-05 ENCOUNTER — Encounter: Payer: Self-pay | Admitting: Internal Medicine

## 2017-05-05 ENCOUNTER — Inpatient Hospital Stay: Payer: Medicare Other

## 2017-05-05 DIAGNOSIS — I959 Hypotension, unspecified: Secondary | ICD-10-CM | POA: Diagnosis not present

## 2017-05-05 DIAGNOSIS — Z888 Allergy status to other drugs, medicaments and biological substances status: Secondary | ICD-10-CM | POA: Diagnosis not present

## 2017-05-05 DIAGNOSIS — Y95 Nosocomial condition: Secondary | ICD-10-CM | POA: Diagnosis present

## 2017-05-05 DIAGNOSIS — J9601 Acute respiratory failure with hypoxia: Secondary | ICD-10-CM | POA: Diagnosis present

## 2017-05-05 DIAGNOSIS — J189 Pneumonia, unspecified organism: Secondary | ICD-10-CM | POA: Diagnosis present

## 2017-05-05 DIAGNOSIS — E860 Dehydration: Secondary | ICD-10-CM | POA: Diagnosis present

## 2017-05-05 DIAGNOSIS — G301 Alzheimer's disease with late onset: Secondary | ICD-10-CM | POA: Diagnosis not present

## 2017-05-05 DIAGNOSIS — I248 Other forms of acute ischemic heart disease: Secondary | ICD-10-CM | POA: Diagnosis present

## 2017-05-05 DIAGNOSIS — F0280 Dementia in other diseases classified elsewhere without behavioral disturbance: Secondary | ICD-10-CM | POA: Diagnosis present

## 2017-05-05 DIAGNOSIS — I351 Nonrheumatic aortic (valve) insufficiency: Secondary | ICD-10-CM

## 2017-05-05 DIAGNOSIS — R1310 Dysphagia, unspecified: Secondary | ICD-10-CM | POA: Diagnosis present

## 2017-05-05 DIAGNOSIS — K219 Gastro-esophageal reflux disease without esophagitis: Secondary | ICD-10-CM | POA: Diagnosis present

## 2017-05-05 DIAGNOSIS — G309 Alzheimer's disease, unspecified: Secondary | ICD-10-CM | POA: Diagnosis present

## 2017-05-05 DIAGNOSIS — Z8701 Personal history of pneumonia (recurrent): Secondary | ICD-10-CM | POA: Diagnosis not present

## 2017-05-05 DIAGNOSIS — I5031 Acute diastolic (congestive) heart failure: Secondary | ICD-10-CM | POA: Diagnosis present

## 2017-05-05 DIAGNOSIS — N179 Acute kidney failure, unspecified: Secondary | ICD-10-CM | POA: Diagnosis present

## 2017-05-05 DIAGNOSIS — L89151 Pressure ulcer of sacral region, stage 1: Secondary | ICD-10-CM | POA: Diagnosis present

## 2017-05-05 DIAGNOSIS — Z85028 Personal history of other malignant neoplasm of stomach: Secondary | ICD-10-CM | POA: Diagnosis not present

## 2017-05-05 DIAGNOSIS — Z87891 Personal history of nicotine dependence: Secondary | ICD-10-CM | POA: Diagnosis not present

## 2017-05-05 DIAGNOSIS — Z79899 Other long term (current) drug therapy: Secondary | ICD-10-CM | POA: Diagnosis not present

## 2017-05-05 DIAGNOSIS — N289 Disorder of kidney and ureter, unspecified: Secondary | ICD-10-CM | POA: Diagnosis present

## 2017-05-05 DIAGNOSIS — Z86010 Personal history of colonic polyps: Secondary | ICD-10-CM | POA: Diagnosis not present

## 2017-05-05 DIAGNOSIS — Z66 Do not resuscitate: Secondary | ICD-10-CM | POA: Diagnosis present

## 2017-05-05 DIAGNOSIS — R64 Cachexia: Secondary | ICD-10-CM | POA: Diagnosis not present

## 2017-05-05 DIAGNOSIS — Z515 Encounter for palliative care: Secondary | ICD-10-CM | POA: Diagnosis not present

## 2017-05-05 DIAGNOSIS — Z7189 Other specified counseling: Secondary | ICD-10-CM | POA: Diagnosis not present

## 2017-05-05 LAB — ECHOCARDIOGRAM COMPLETE
HEIGHTINCHES: 70 in
WEIGHTICAEL: 2257.51 [oz_av]

## 2017-05-05 LAB — TROPONIN I
Troponin I: 0.11 ng/mL (ref <0.03)
Troponin I: 0.06 ng/mL (ref <0.03)

## 2017-05-05 LAB — MRSA PCR SCREENING: MRSA BY PCR: NEGATIVE

## 2017-05-05 MED ORDER — SODIUM CHLORIDE 0.9% FLUSH: 3.0000 mL | INTRAVENOUS | Status: DC | PRN

## 2017-05-05 MED ORDER — CYANOCOBALAMIN 500 MCG PO TABS
500.0000 ug | ORAL_TABLET | Freq: Every day | ORAL | Status: DC
  Administered 2017-05-06: 500 ug via ORAL
  Filled 2017-05-05 (×4): qty 1

## 2017-05-05 MED ORDER — SODIUM CHLORIDE 0.9% FLUSH
3.0000 mL | Freq: Two times a day (BID) | INTRAVENOUS | Status: DC
  Administered 2017-05-05 – 2017-05-08 (×7): 3 mL via INTRAVENOUS

## 2017-05-05 MED ORDER — DRONABINOL 2.5 MG PO CAPS
2.5000 mg | ORAL_CAPSULE | Freq: Two times a day (BID) | ORAL | Status: DC
  Administered 2017-05-05 – 2017-05-07 (×3): 2.5 mg via ORAL
  Filled 2017-05-05 (×3): qty 1

## 2017-05-05 MED ORDER — SODIUM CHLORIDE 0.9 % IV SOLN: 250.0000 mL | INTRAVENOUS | Status: DC | PRN

## 2017-05-05 MED ORDER — IPRATROPIUM-ALBUTEROL 0.5-2.5 (3) MG/3ML IN SOLN
3.0000 mL | RESPIRATORY_TRACT | Status: DC
  Administered 2017-05-05 – 2017-05-06 (×5): 3 mL via RESPIRATORY_TRACT
  Filled 2017-05-05 (×6): qty 3

## 2017-05-05 MED ORDER — MIRTAZAPINE 15 MG PO TABS
15.0000 mg | ORAL_TABLET | Freq: Every day | ORAL | Status: DC
  Administered 2017-05-05 – 2017-05-07 (×3): 15 mg via ORAL
  Filled 2017-05-05 (×3): qty 1

## 2017-05-05 MED ORDER — HYPROMELLOSE (GONIOSCOPIC) 2.5 % OP SOLN: 1.0000 [drp] | Freq: Two times a day (BID) | OPHTHALMIC | Status: DC

## 2017-05-05 MED ORDER — ENOXAPARIN SODIUM 40 MG/0.4ML ~~LOC~~ SOLN
40.0000 mg | SUBCUTANEOUS | Status: DC
  Administered 2017-05-05 – 2017-05-08 (×4): 40 mg via SUBCUTANEOUS
  Filled 2017-05-05 (×3): qty 0.4

## 2017-05-05 MED ORDER — LACTULOSE 10 GM/15ML PO SOLN: 20.0000 g | Freq: Every day | ORAL | Status: DC | PRN

## 2017-05-05 MED ORDER — METHYLPREDNISOLONE SODIUM SUCC 40 MG IJ SOLR
40.0000 mg | Freq: Two times a day (BID) | INTRAMUSCULAR | Status: DC
  Administered 2017-05-05 – 2017-05-08 (×7): 40 mg via INTRAVENOUS
  Filled 2017-05-05 (×7): qty 1

## 2017-05-05 MED ORDER — DEXTROSE 5 % IV SOLN
1.0000 g | Freq: Once | INTRAVENOUS | Status: AC
  Administered 2017-05-05: 1 g via INTRAVENOUS

## 2017-05-05 MED ORDER — BENZONATATE 100 MG PO CAPS: 100.0000 mg | ORAL_CAPSULE | Freq: Three times a day (TID) | ORAL | Status: DC

## 2017-05-05 MED ORDER — ONDANSETRON HCL 4 MG PO TABS
4.0000 mg | ORAL_TABLET | Freq: Four times a day (QID) | ORAL | Status: DC | PRN
Start: 2017-05-05 — End: 2017-05-08

## 2017-05-05 MED ORDER — ACETAMINOPHEN 650 MG RE SUPP
650.0000 mg | Freq: Four times a day (QID) | RECTAL | Status: DC | PRN
Start: 2017-05-05 — End: 2017-05-08

## 2017-05-05 MED ORDER — POLYETHYLENE GLYCOL 3350 17 G PO PACK
17.0000 g | PACK | Freq: Every day | ORAL | Status: DC
  Administered 2017-05-07: 17 g via ORAL
  Filled 2017-05-05 (×2): qty 1

## 2017-05-05 MED ORDER — ACETYLCYSTEINE 10 % IN SOLN
2.0000 mL | Freq: Three times a day (TID) | RESPIRATORY_TRACT | Status: DC
  Filled 2017-05-05 (×2): qty 4

## 2017-05-05 MED ORDER — CEFEPIME HCL 1 G IJ SOLR
INTRAMUSCULAR | Status: AC
  Administered 2017-05-05: 1 g via INTRAVENOUS
  Filled 2017-05-05: qty 1

## 2017-05-05 MED ORDER — SODIUM CHLORIDE 0.9 % IV BOLUS (SEPSIS)
500.0000 mL | Freq: Once | INTRAVENOUS | Status: AC
  Administered 2017-05-05: 500 mL via INTRAVENOUS

## 2017-05-05 MED ORDER — VANCOMYCIN HCL IN DEXTROSE 1-5 GM/200ML-% IV SOLN
1000.0000 mg | Freq: Once | INTRAVENOUS | Status: AC
  Administered 2017-05-05: 1000 mg via INTRAVENOUS

## 2017-05-05 MED ORDER — TAMSULOSIN HCL 0.4 MG PO CAPS
0.4000 mg | ORAL_CAPSULE | Freq: Every day | ORAL | Status: DC
  Administered 2017-05-05 – 2017-05-07 (×3): 0.4 mg via ORAL
  Filled 2017-05-05 (×3): qty 1

## 2017-05-05 MED ORDER — SODIUM CHLORIDE 0.9 % IV SOLN
1250.0000 mg | INTRAVENOUS | Status: DC
  Administered 2017-05-05: 1250 mg via INTRAVENOUS
  Filled 2017-05-05: qty 1250

## 2017-05-05 MED ORDER — GUAIFENESIN 100 MG/5ML PO SOLN
5.0000 mL | ORAL | Status: DC | PRN
  Filled 2017-05-05: qty 5

## 2017-05-05 MED ORDER — BUDESONIDE 0.5 MG/2ML IN SUSP
0.5000 mg | Freq: Two times a day (BID) | RESPIRATORY_TRACT | Status: DC
  Administered 2017-05-05 – 2017-05-08 (×6): 0.5 mg via RESPIRATORY_TRACT
  Filled 2017-05-05 (×7): qty 2

## 2017-05-05 MED ORDER — ACETYLCYSTEINE 20 % IN SOLN
2.0000 mL | Freq: Three times a day (TID) | RESPIRATORY_TRACT | Status: DC
  Filled 2017-05-05: qty 4

## 2017-05-05 MED ORDER — ALBUTEROL SULFATE (2.5 MG/3ML) 0.083% IN NEBU
2.5000 mg | INHALATION_SOLUTION | Freq: Three times a day (TID) | RESPIRATORY_TRACT | Status: DC
  Filled 2017-05-05: qty 3

## 2017-05-05 MED ORDER — FUROSEMIDE 10 MG/ML IJ SOLN
20.0000 mg | Freq: Every day | INTRAMUSCULAR | Status: DC
  Administered 2017-05-05: 20 mg via INTRAVENOUS
  Filled 2017-05-05: qty 2

## 2017-05-05 MED ORDER — ADULT MULTIVITAMIN W/MINERALS CH
1.0000 | ORAL_TABLET | Freq: Every day | ORAL | Status: DC
  Administered 2017-05-06 – 2017-05-07 (×2): 1 via ORAL
  Filled 2017-05-05 (×2): qty 1

## 2017-05-05 MED ORDER — FUROSEMIDE 10 MG/ML IJ SOLN: 40.0000 mg | Freq: Once | INTRAMUSCULAR | Status: DC

## 2017-05-05 MED ORDER — POLYVINYL ALCOHOL 1.4 % OP SOLN
1.0000 [drp] | Freq: Two times a day (BID) | OPHTHALMIC | Status: DC
  Administered 2017-05-06 – 2017-05-08 (×6): 1 [drp] via OPHTHALMIC
  Filled 2017-05-05: qty 15

## 2017-05-05 MED ORDER — DEXTROSE 5 % IV SOLN
2.0000 g | INTRAVENOUS | Status: DC
  Administered 2017-05-05 – 2017-05-07 (×3): 2 g via INTRAVENOUS
  Filled 2017-05-05 (×4): qty 2

## 2017-05-05 MED ORDER — ONDANSETRON HCL 4 MG/2ML IJ SOLN: 4.0000 mg | Freq: Four times a day (QID) | INTRAMUSCULAR | Status: DC | PRN

## 2017-05-05 MED ORDER — VANCOMYCIN HCL IN DEXTROSE 1-5 GM/200ML-% IV SOLN
INTRAVENOUS | Status: AC
  Administered 2017-05-05: 1000 mg via INTRAVENOUS
  Filled 2017-05-05: qty 200

## 2017-05-05 MED ORDER — POTASSIUM CHLORIDE 20 MEQ/15ML (10%) PO SOLN
20.0000 meq | Freq: Every day | ORAL | Status: DC
  Administered 2017-05-05: 20 meq via ORAL
  Filled 2017-05-05 (×2): qty 15

## 2017-05-05 MED ORDER — MEMANTINE HCL 5 MG PO TABS
5.0000 mg | ORAL_TABLET | Freq: Two times a day (BID) | ORAL | Status: DC
  Administered 2017-05-05 – 2017-05-07 (×6): 5 mg via ORAL
  Filled 2017-05-05 (×7): qty 1

## 2017-05-05 MED ORDER — ACETAMINOPHEN 325 MG PO TABS
650.0000 mg | ORAL_TABLET | Freq: Four times a day (QID) | ORAL | Status: DC | PRN
Start: 2017-05-05 — End: 2017-05-08

## 2017-05-05 MED ORDER — BENZONATATE 100 MG PO CAPS
100.0000 mg | ORAL_CAPSULE | Freq: Three times a day (TID) | ORAL | Status: DC | PRN
Start: 2017-05-05 — End: 2017-05-08

## 2017-05-05 MED ORDER — IPRATROPIUM BROMIDE 0.03 % NA SOLN
2.0000 | Freq: Three times a day (TID) | NASAL | Status: DC
  Administered 2017-05-06 – 2017-05-07 (×4): 2 via NASAL
  Filled 2017-05-05: qty 30

## 2017-05-05 NOTE — Significant Event (Signed)
Rapid Response Event Note  Overview: called rrt for low bp, low 02 sats      Initial Focused Assessment: Pt awake in bed, bp 85/49, 02 probe changed to ear...02 sats 99% on 2/3L [place]  Interventions: spoke with Dr Mortimer Fries, stat portable cxr ordered, pt DNR/DNI... Miscellaneous oder to keep BP map 55 or greater  Plan of Care (if not transferred): wait for CXR results, continue to monitor, called RRT nurse if needed, communicated to Estill Bamberg, RN  Event Summary:   at      at          [place]

## 2017-05-05 NOTE — H&P (Addendum)
[facility] [place] NAME: Corey Clark    MR#:  [id number]  DATE OF BIRTH:  11-21-1924  DATE OF ADMISSION:  04/14/2017  PRIMARY CARE PHYSICIAN: Venia Carbon, MD   REQUESTING/REFERRING PHYSICIAN:   CHIEF COMPLAINT:   Chief Complaint  Patient presents with  . Cough    HISTORY OF PRESENT ILLNESS: Corey Clark  is a 83 y.o. male with a known history of Alzheimer's dementia, prostate hypertrophy, GERD, osteoarthritis, squamous cell carcinoma, stomach cancer, malt lymphoma presented to the emergency room with increased cough and fever. Patient was recently treated 2 weeks ago with a course of oral antibiotics for pneumonia. He still has cough productive for yellowish phlegm. He was also short of breath and was put on oxygen via nasal cannula in the emergency room. Patient was worked up with chest x-ray which showed a bilateral pneumonia and pleural effusion. He was started on broad-spectrum IV antibiotics in the emergency room. [facility]. Hospitalist service was consulted for further care of the patient.  PAST MEDICAL HISTORY:   Past Medical History:  Diagnosis Date  . Alzheimer's dementia   . BPH (benign prostatic hypertrophy)   . Colon polyps   . GERD (gastroesophageal reflux disease)   . Neuropathy due to drug [facility])    chemo for stomach cancer  . Osteoarthrosis involving, or with mention of more than one site, but not specified as generalized, multiple sites   . SCC (squamous cell carcinoma), face 2014   left preauricular---[facility]  . Stomach cancer ([place]) 1999   MALT lymphoma --surgery then chemo. Bad infection after with 1 month hospitalization    PAST SURGICAL HISTORY: Past Surgical History:  Procedure Laterality Date  . CATARACT EXTRACTION, BILATERAL  2002, 2003  . [place]  . PARTIAL GASTRECTOMY  1999   2 procedures for stomach cancer  . ROTATOR CUFF  REPAIR  1991/1998   left and right done  . TENDON RELEASE Left 6/15   Dr Jefm Bryant    SOCIAL HISTORY:  Social History  Substance Use Topics  . Smoking status: Former Smoker    Quit date: 10/15/1950  . Smokeless tobacco: Never Used  . Alcohol use Yes     Comment: rare wine    FAMILY HISTORY:  Family History  Problem Relation Age of Onset  . Cancer Brother   . Heart disease Neg Hx   . Hypertension Neg Hx   . Diabetes Neg Hx     DRUG ALLERGIES:  Allergies  Allergen Reactions  . Tetracycline Other (See Comments)    Sick to stomach  . Tetracyclines & Related Other (See Comments)    Patient not sure it's been years since he's taken med    REVIEW OF SYSTEMS:   CONSTITUTIONAL: No fever, has weakness.  EYES: No blurred or double vision.  EARS, NOSE, AND THROAT: No tinnitus or ear pain.  RESPIRATORY: Has cough, shortness of breath, No  wheezing or hemoptysis.  CARDIOVASCULAR: No chest pain, orthopnea, edema.  GASTROINTESTINAL: No nausea, vomiting, diarrhea or abdominal pain.  GENITOURINARY: No dysuria, hematuria.  ENDOCRINE: No polyuria, nocturia,  HEMATOLOGY: No anemia, easy bruising or bleeding SKIN: No rash or lesion. MUSCULOSKELETAL: No joint pain or arthritis.   NEUROLOGIC: No tingling, numbness, weakness.  PSYCHIATRY: No anxiety or depression.   MEDICATIONS AT HOME:  Prior to Admission medications   Medication Sig Start Date End Date Taking? Authorizing Provider  CARAFATE 1 GM/10ML suspension TAKE 10 MLS (1 GRAM TOTAL) TWO TIMES DAILY 01/14/17  Yes Viviana Simpler I, MD  furosemide (LASIX) 20 MG tablet Take 20 mg by mouth daily.   Yes [provider]  hydroxypropyl methylcellulose / hypromellose (ISOPTO TEARS / GONIOVISC) 2.5 % ophthalmic solution Place 1 drop into both eyes 2 (two) times daily.   Yes [provider]  Lactulose 20 GM/30ML SOLN Take 30 mLs (20 g total) by mouth daily as needed. 08/16/16  Yes Eula Listen, MD  memantine  (NAMENDA) 5 MG tablet Take 1 tablet (5 mg total) by mouth 2 (two) times daily. 12/11/16  Yes Venia Carbon, MD  mirtazapine (REMERON) 15 MG tablet Take 1 tablet (15 mg total) by mouth at bedtime. 01/01/17  Yes Venia Carbon, MD  Multiple Vitamin (MULTIVITAMIN) tablet Take 1 tablet by mouth daily.   Yes [provider]  polyethylene glycol (MIRALAX / GLYCOLAX) packet Take 17 g by mouth daily.   Yes [provider]  tamsulosin (FLOMAX) 0.4 MG CAPS capsule Take 0.4 mg by mouth daily.   Yes [provider]  vitamin B-12 (CYANOCOBALAMIN) 500 MCG tablet Take 500 mcg by mouth daily.   Yes [provider]  dronabinol (MARINOL) 2.5 MG capsule Take 2.5 mg by mouth 2 (two) times daily before a meal.    [provider]  ipratropium (ATROVENT) 0.03 % nasal spray Place 2 sprays into both nostrils 3 (three) times daily.    [provider]  levofloxacin (LEVAQUIN) 500 MG tablet Take 1 tablet (500 mg total) by mouth daily. For 3 days---then 1 every other day Patient not taking: Reported on 04/28/2017 01/14/17   Viviana Simpler I, MD      PHYSICAL EXAMINATION:   VITAL SIGNS: Blood pressure 94/61, pulse 78, temperature 98.1 F (36.7 C), resp. rate 16, height 5\' 10"  (1.778 m), weight 64 kg (141 lb 1.5 oz), SpO2 94 %.  GENERAL:  83 y.o.-year-old patient lying in the bed with no acute distress.  EYES: Pupils equal, round, reactive to light and accommodation. No scleral icterus. Extraocular muscles intact.  HEENT: Head atraumatic, normocephalic. Oropharynx and nasopharynx clear.  NECK:  Supple, no jugular venous distention. No thyroid enlargement, no tenderness.  LUNGS: Decreased breath sounds bilaterally, no wheezing, rales heard in right lung. No use of accessory muscles of respiration.  CARDIOVASCULAR: S1, S2 normal. No murmurs, rubs, or gallops.  ABDOMEN: Soft, nontender, nondistended. Bowel sounds present. No organomegaly or mass.  EXTREMITIES: No pedal  edema, cyanosis, or clubbing.  NEUROLOGIC: Cranial nerves II through XII are intact. Muscle strength 5/5 in all extremities. Sensation intact. Gait not checked. Has dementia PSYCHIATRIC: The patient is alert and oriented x 2.  SKIN: No obvious rash, lesion, or ulcer.   LABORATORY PANEL:   CBC  Recent Labs Lab 04/27/2017 1957  WBC 6.5  HGB 11.9*  HCT 35.4*  PLT 132*  MCV 91.0  MCH 30.5  MCHC 33.5  RDW 14.5  LYMPHSABS 0.5*  MONOABS 0.6  EOSABS 0.0  BASOSABS 0.0   ------------------------------------------------------------------------------------------------------------------  Chemistries   Recent Labs Lab 04/19/2017 1957  NA 140  K 3.5  CL 103  CO2 30  GLUCOSE 116*  BUN 26*  CREATININE 1.01  CALCIUM 8.3*  AST 52*  ALT 24  ALKPHOS 118  BILITOT 1.0   ------------------------------------------------------------------------------------------------------------------ estimated creatinine clearance is 44.9 mL/min (by C-G formula based on SCr of 1.01 mg/dL). ------------------------------------------------------------------------------------------------------------------ No results for input(s): TSH, T4TOTAL, T3FREE, THYROIDAB in  the last 72 hours.  Invalid input(s): FREET3   Coagulation profile No results for input(s): INR, PROTIME in the last 168 hours. ------------------------------------------------------------------------------------------------------------------- No results for input(s): DDIMER in the last 72 hours. -------------------------------------------------------------------------------------------------------------------  Cardiac Enzymes  Recent Labs Lab 04/14/2017 1957  TROPONINI 0.03*   ------------------------------------------------------------------------------------------------------------------ Invalid input(s):  POCBNP  ---------------------------------------------------------------------------------------------------------------  Urinalysis    Component Value Date/Time   COLORURINE AMBER (A) 05/03/2017 2210   APPEARANCEUR CLOUDY (A) 04/28/2017 2210   APPEARANCEUR Clear 03/21/2014 0117   LABSPEC 1.020 05/11/2017 2210   LABSPEC 1.018 03/21/2014 0117   PHURINE 5.0 05/01/2017 2210   GLUCOSEU NEGATIVE 05/10/2017 2210   GLUCOSEU Negative 03/21/2014 0117   HGBUR NEGATIVE 04/21/2017 2210   BILIRUBINUR NEGATIVE 04/25/2017 2210   BILIRUBINUR Negative 03/21/2014 0117   KETONESUR NEGATIVE 05/05/2017 2210   PROTEINUR 30 (A) 04/19/2017 2210   NITRITE NEGATIVE 04/29/2017 2210   LEUKOCYTESUR NEGATIVE 04/27/2017 2210   LEUKOCYTESUR Negative 03/21/2014 0117     RADIOLOGY: Ct Chest W Contrast  Result Date: 05/05/2017 CLINICAL DATA:  Cough and fever EXAM: CT CHEST WITH CONTRAST TECHNIQUE: Multidetector CT imaging of the chest was performed during intravenous contrast administration. CONTRAST:  60mL ISOVUE-300 IOPAMIDOL (ISOVUE-300) INJECTION 61% COMPARISON:  05/06/2017, 01/14/2017, 04/26/2014 FINDINGS: Cardiovascular: Non aneurysmal aorta. Aortic atherosclerosis. Coronary artery calcification. Heart size within normal limits. No large pericardial effusion is seen. Mediastinum/Nodes: Mild mediastinal adenopathy, a right pretracheal lymph node measures 15 mm. Midline trachea. No thyroid mass. Mild fluid and air distention of the esophagus. Abrupt termination of the distal right bronchus in the right bronchus. Lungs/Pleura: Mild emphysematous disease. Moderate right-sided pleural effusion and trace left pleural effusion. Multifocal consolidations within the bilateral upper lobes, right middle lobe, and right lower lobe. Partial consolidation in the left lower lobe. Nodular focus of airspace disease in the lingula. No pneumothorax. Upper Abdomen: Postsurgical changes of the stomach. High density foci in the right  upper quadrant may reflect large calcified gallstones. This is incompletely visualized. Musculoskeletal: Degenerative changes. Moderate compression fracture of T12 with estimated 40% loss of height anteriorly. Some gas in the vertebral body. IMPRESSION: 1. Moderate right-sided pleural effusion and trace left pleural effusion. Multifocal consolidations within the bilateral lungs, suspicious for multifocal pneumonia. Imaging follow-up to resolution recommended to exclude underlying neoplasm. Abrupt termination of the distal right bronchus with apparent fluid or debris in the distal bronchus. 2. Mild mediastinal adenopathy, likely reactive. 3. Partially visualized density in the right upper quadrant of the abdomen, likely gallstones. 4. Moderate compression fracture of T12, progressed since radiograph April 2018. 5. Mild mediastinal adenopathy, probably reactive Aortic Atherosclerosis (ICD10-I70.0) and Emphysema (ICD10-J43.9). Electronically Signed   By: Donavan Foil M.D.   On: 05/05/2017 00:26   Dg Chest Port 1 View  Result Date: 04/28/2017 CLINICAL DATA:  Wet cough EXAM: PORTABLE CHEST 1 VIEW COMPARISON:  01/14/2017 FINDINGS: Bilateral pleural effusions, small on the left and moderate on the right. Consolidation at the right lung base. Stable cardiomediastinal silhouette. Increased opacity within the left lung apex. Aortic atherosclerosis. No pneumothorax. IMPRESSION: 1. Bilateral pleural effusions, small on the left, moderate on the right and appears increased on the right side. 2. Right basilar consolidation persists and may reflect atelectasis or pneumonia. Increased density at the left greater than right lung apex, cannot exclude additional infiltrates. Electronically Signed   By: Donavan Foil M.D.   On: 05/05/2017 20:24    EKG: Orders placed or performed during the hospital encounter of 04/17/2017  . ED EKG 12-Lead  .  ED EKG 12-Lead  . EKG 12-Lead  . EKG 12-Lead    IMPRESSION AND  PLAN: 83 year old male patient with history of Alzheimer's dementia, prostate hypertrophy, GERD, osteoarthritis, stomach cancer presented to the emergency room with cough and fever. Admitting diagnosis 1. Healthcare associated pneumonia 2.Acute hypoxic respiratory failure 3. Bilateral pleural effusions 4. Alzheimer's dementia 5. GERD  Treatment plan Admit patient to step down unit Start patient on IV vancomycin and IV cefepime antibiotic Oxygen via bipap to keep saturation greater than 92% Decongestants and antitussive medication DVT prophylaxis with subcutaneous Lovenox 40 MG daily Judicious use of IV fluids  All the records are reviewed and case discussed with ED provider. Management plans discussed with the patient, family and they are in agreement.  CODE STATUS:FULL CODE Surrogate Decision Maker : Wife Code Status History    This patient does not have a recorded code status. Please follow your organizational policy for patients in this situation.    Advance Directive Documentation     Most Recent Value  Type of Advance Directive  Out of facility DNR (pink MOST or yellow form)  Pre-existing out of facility DNR order (yellow form or pink MOST form)  -  "MOST" Form in Place?  -       TOTAL TIME TAKING CARE OF THIS PATIENT: 52 minutes.    Saundra Shelling M.D on 05/05/2017 at 2:56 AM  Between 7am to 6pm - Pager - [phone]  After 6pm go to [email or web address] - password [email or web address] [place] [facility]  Office  [phone]  CC: Primary care physician; Venia Carbon, MD

## 2017-05-05 NOTE — Progress Notes (Signed)
  Report called to Estill Bamberg, RN on 1A.  Patient has been alert to self.  Confused and uncooperative at times.  Cooperative with his wife's requests usually.  NSR per cardiac monitor.  o2 sats 93% on 2L nasal cannula. Audible crackles at times and Dr. Mortimer Fries aware.  Patient for thoracentesis tomorrow.

## 2017-05-05 NOTE — ED Notes (Signed)
Pt o2sat not improving, o2 increased to 5L, pt coached to take deep breaths through nose, o2 only improve to 91%

## 2017-05-05 NOTE — ED Notes (Signed)
RT at bedside.

## 2017-05-05 NOTE — Consult Note (Signed)
Name: Corey Clark MRN: [id number] DOB: 07-27-1925    ADMISSION DATE:  04/24/2017  CONSULTATION DATE: 05/05/17  REFERRING MD :  Dr. Estanislado Pandy  CHIEF COMPLAINT: Cough  BRIEF PATIENT DESCRIPTION: 83 year old with acute respiratory failure requiring BiPAP  Secondary to bilateral pleural effusion/ PNA  SIGNIFICANT EVENTS  7/22 Patient admitted to the stepdown due to acute respiratory failure,requiring BiPAP  STUDIES:  7/22 CT scan>>Moderate right-sided pleural effusion and trace left pleural effusion. Multifocal consolidations within the bilateral lungs,suspicious for multifocal pneumonia. Imaging follow-up to resolutionrecommended to exclude underlying neoplasm. Abrupt termination ofthe distal right bronchus with apparent fluid or debris in the distal bronchus.2. Mild mediastinal adenopathy, likely reactive.. Partially visualized density in the right upper quadrant of theabdomen, likely gallstones.4. Moderate compression fracture of T12, progressed since radiograph April 2018. Mild mediastinal adenopathy, probably reactive  HISTORY OF PRESENT ILLNESS:  Corey Clark is an 83 year old male with Known history of  Alzheimer's disease, dementia,BPH,GERD,Osteoarthritis,squamous cell carcinoma, stomach [phone]). Patient  Presented to ED with increased cough and fever of 100.1 f.  Patient just completed his coarse of antibiotics 3 weeks ago, however he has worsening cough.  CXR was concerning for b/l pleural efussion R>L and multifocal PNA.  Patient was placed on BiPAP for shortness of breath and was sent to the ICU for further monitoring.  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  has a past surgical history that includes Partial gastrectomy (1999); Rotator cuff repair (1991/1998); Hemorrhoid surgery (1998 & 2000); Cataract extraction, bilateral (2002, 2003); and Tendon release (Left, 6/15). Prior to Admission medications   Medication Sig Start Date End Date Taking? Authorizing Provider  CARAFATE 1 GM/10ML suspension TAKE 10 MLS (1 GRAM TOTAL) TWO TIMES DAILY 01/14/17  Yes Viviana Simpler I, MD  furosemide (LASIX) 20 MG tablet Take 20 mg by mouth daily.   Yes [provider]  hydroxypropyl methylcellulose / hypromellose (ISOPTO TEARS / GONIOVISC) 2.5 % ophthalmic solution Place 1 drop into both eyes 2 (two) times daily.   Yes [provider]  Lactulose 20 GM/30ML SOLN Take 30 mLs (20 g total) by mouth daily as needed. 08/16/16  Yes Eula Listen, MD  memantine (NAMENDA) 5 MG tablet Take 1 tablet (5 mg total) by mouth 2 (two) times daily. 12/11/16  Yes Venia Carbon, MD  mirtazapine (REMERON) 15 MG tablet Take 1 tablet (15 mg total) by mouth at bedtime. 01/01/17  Yes Venia Carbon, MD  Multiple Vitamin (MULTIVITAMIN) tablet Take 1 tablet by mouth daily.   Yes [provider]  polyethylene glycol (MIRALAX / GLYCOLAX) packet Take 17 g by mouth daily.   Yes [provider]  tamsulosin (FLOMAX) 0.4 MG CAPS capsule Take 0.4 mg by mouth daily.   Yes [provider]  vitamin B-12 (CYANOCOBALAMIN) 500 MCG tablet Take 500 mcg by mouth daily.   Yes [provider]  dronabinol (MARINOL) 2.5 MG capsule Take 2.5 mg by mouth 2 (two) times daily before a meal.    [provider]  ipratropium (ATROVENT) 0.03 % nasal spray Place 2 sprays into both nostrils 3 (three) times daily.    [provider]  levofloxacin (LEVAQUIN) 500 MG  tablet Take 1 tablet (500 mg total) by mouth daily. For 3 days---then 1 every other day Patient not taking: Reported on 05/08/2017 01/14/17    Venia Carbon, MD   Allergies  Allergen Reactions  . Tetracycline Other (See Comments)    Sick to stomach  . Tetracyclines & Related Other (See Comments)    Patient not sure it's been years since he's taken med    FAMILY HISTORY:  family history includes Cancer in his brother. SOCIAL HISTORY:  reports that he quit smoking about 66 years ago. He has never used smokeless tobacco. He reports that he drinks alcohol. He reports that he does not use drugs.  REVIEW OF SYSTEMS:   Constitutional: Negative for fever, chills, weight loss, malaise/fatigue and diaphoresis.  HENT: Negative for hearing loss, ear pain, nosebleeds, congestion, sore throat, neck pain, tinnitus and ear discharge.   Eyes: Negative for blurred vision, double vision, photophobia, pain, discharge and redness.  Respiratory: Negative for cough, hemoptysis, sputum production, shortness of breath, wheezing and stridor.   Cardiovascular: Negative for chest pain, palpitations, orthopnea, claudication, leg swelling and PND.  Gastrointestinal: Negative for heartburn, nausea, vomiting, abdominal pain, diarrhea, constipation, blood in stool and melena.  Genitourinary: Negative for dysuria, urgency, frequency, hematuria and flank pain.  Musculoskeletal: Negative for myalgias, back pain, joint pain and falls.  Skin: Negative for itching and rash.  Neurological: Negative for dizziness, tingling, tremors, sensory change, speech change, focal weakness, seizures, loss of consciousness, weakness and headaches.  Endo/Heme/Allergies: Negative for environmental allergies and polydipsia. Does not bruise/bleed easily.  SUBJECTIVE: Patient states that "he is feeling fine and is not short of breath. Denies any pain"  VITAL SIGNS: Temp:  [97.4 F (36.3 C)-98.1 F (36.7 C)] 97.4 F (36.3 C) (07/22 0401) Pulse Rate:  [74-109] 79 (07/22 0430) Resp:  [14-24] 16 (07/22 0430) BP: (91-108)/(54-65) 108/65 (07/22 0430) SpO2:  [86 %-100 %] 95 %  (07/22 0430) Weight:  [64 kg (141 lb 1.5 oz)] 64 kg (141 lb 1.5 oz) (07/21 1947)  PHYSICAL EXAMINATION: General:  Elderly caucasian male, in no acute distress Neuro: Awake,Alert and oriented HEENT: Atraumatic,Normocephalic,no JVD Cardiovascular:  S1S2,Regular, no m/r/g Lungs: coarse throughout, no wheezes,rhonchi noted Abdomen: soft,NT,ND Musculoskeletal:  No edema,cyanosis Skin:  Warm,dry and intact   Recent Labs Lab 04/25/2017 1957  NA 140  K 3.5  CL 103  CO2 30  BUN 26*  CREATININE 1.01  GLUCOSE 116*    Recent Labs Lab 04/26/2017 1957  HGB 11.9*  HCT 35.4*  WBC 6.5  PLT 132*   Ct Chest W Contrast  Result Date: 05/05/2017 CLINICAL DATA:  Cough and fever EXAM: CT CHEST WITH CONTRAST TECHNIQUE: Multidetector CT imaging of the chest was performed during intravenous contrast administration. CONTRAST:  99mL ISOVUE-300 IOPAMIDOL (ISOVUE-300) INJECTION 61% COMPARISON:  04/15/2017, 01/14/2017, 04/26/2014 FINDINGS: Cardiovascular: Non aneurysmal aorta. Aortic atherosclerosis. Coronary artery calcification. Heart size within normal limits. No large pericardial effusion is seen. Mediastinum/Nodes: Mild mediastinal adenopathy, a right pretracheal lymph node measures 15 mm. Midline trachea. No thyroid mass. Mild fluid and air distention of the esophagus. Abrupt termination of the distal right bronchus in the right bronchus. Lungs/Pleura: Mild emphysematous disease. Moderate right-sided pleural effusion and trace left pleural effusion. Multifocal consolidations within the bilateral upper lobes, right middle lobe, and right lower lobe. Partial consolidation in the left lower lobe. Nodular focus of airspace disease in the lingula. No pneumothorax. Upper Abdomen: Postsurgical changes of the stomach. High density foci in the right upper quadrant  may reflect large calcified gallstones. This is incompletely visualized. Musculoskeletal: Degenerative changes. Moderate compression fracture of T12 with  estimated 40% loss of height anteriorly. Some gas in the vertebral body. IMPRESSION: 1. Moderate right-sided pleural effusion and trace left pleural effusion. Multifocal consolidations within the bilateral lungs, suspicious for multifocal pneumonia. Imaging follow-up to resolution recommended to exclude underlying neoplasm. Abrupt termination of the distal right bronchus with apparent fluid or debris in the distal bronchus. 2. Mild mediastinal adenopathy, likely reactive. 3. Partially visualized density in the right upper quadrant of the abdomen, likely gallstones. 4. Moderate compression fracture of T12, progressed since radiograph April 2018. 5. Mild mediastinal adenopathy, probably reactive Aortic Atherosclerosis (ICD10-I70.0) and Emphysema (ICD10-J43.9). Electronically Signed   By: Donavan Foil M.D.   On: 05/05/2017 00:26   Dg Chest Port 1 View  Result Date: 05/03/2017 CLINICAL DATA:  Wet cough EXAM: PORTABLE CHEST 1 VIEW COMPARISON:  01/14/2017 FINDINGS: Bilateral pleural effusions, small on the left and moderate on the right. Consolidation at the right lung base. Stable cardiomediastinal silhouette. Increased opacity within the left lung apex. Aortic atherosclerosis. No pneumothorax. IMPRESSION: 1. Bilateral pleural effusions, small on the left, moderate on the right and appears increased on the right side. 2. Right basilar consolidation persists and may reflect atelectasis or pneumonia. Increased density at the left greater than right lung apex, cannot exclude additional infiltrates. Electronically Signed   By: Donavan Foil M.D.   On: 04/22/2017 20:24    ASSESSMENT / PLAN:  Multifocal Pneumonia Bilateral Pleural effussion R>L Hypotension Fever Hx Of GERD Hx of BPH Hx of Alzeimer's  Plan Support with O2 to keep oxygen sats>92% WBC WNL Monitor fever,CBC Keep MAP goals of 55-60 Continue Vancomycin /cefipime Started robitussin,tessalon pearls and mucomyst neb Continue  Bronchodilator Continue Flomax Follow cultures F/U BMET intermittently Continue Remeron      Annalina Needles,AG-ACNP Pulmonary and [place]   05/05/2017, 4:59 AM

## 2017-05-05 NOTE — Progress Notes (Signed)
RRT called, BP low, O2 sats low.

## 2017-05-05 NOTE — Progress Notes (Signed)
Patient moved to room 143 by bed on o2 with RN and Lovena Le, NT.  Alert with no distress noted and wife at bedside in new room.  Estill Bamberg, RN aware that patient is in room.

## 2017-05-05 NOTE — Progress Notes (Signed)
Wife just left bedside to go home for the night.

## 2017-05-05 NOTE — ED Provider Notes (Signed)
I assumed care of the patient from Dr. Burlene Arnt at 73:88 PM. 83 year old male presenting with history of pneumonia for which the patient completed antibiotics approximately 3 weeks ago however family states that patient has had worsening cough. Patient was noted to have a temperature 100.1 was given Tylenol before presentation to the emergency department. Patient's oxygen saturation also noted to be low. Chest x-ray revealed: CLINICAL DATA:  Wet cough  EXAM: PORTABLE CHEST 1 VIEW  COMPARISON:  01/14/2017  FINDINGS: Bilateral pleural effusions, small on the left and moderate on the right. Consolidation at the right lung base. Stable cardiomediastinal silhouette. Increased opacity within the left lung apex. Aortic atherosclerosis. No pneumothorax.  IMPRESSION: 1. Bilateral pleural effusions, small on the left, moderate on the right and appears increased on the right side. 2. Right basilar consolidation persists and may reflect atelectasis or pneumonia. Increased density at the left greater than right lung apex, cannot exclude additional infiltrates.   Electronically Signed   By: Donavan Foil M.D.   On: 04/27/2017 20:24   CLINICAL DATA:  Cough and fever  EXAM: CT CHEST WITH CONTRAST  TECHNIQUE: Multidetector CT imaging of the chest was performed during intravenous contrast administration.  CONTRAST:  48mL ISOVUE-300 IOPAMIDOL (ISOVUE-300) INJECTION 61%  COMPARISON:  05/13/2017, 01/14/2017, 04/26/2014  FINDINGS: Cardiovascular: Non aneurysmal aorta. Aortic atherosclerosis. Coronary artery calcification. Heart size within normal limits. No large pericardial effusion is seen.  Mediastinum/Nodes: Mild mediastinal adenopathy, a right pretracheal lymph node measures 15 mm. Midline trachea. No thyroid mass. Mild fluid and air distention of the esophagus. Abrupt termination of the distal right bronchus in the right bronchus.  Lungs/Pleura: Mild emphysematous  disease. Moderate right-sided pleural effusion and trace left pleural effusion. Multifocal consolidations within the bilateral upper lobes, right middle lobe, and right lower lobe. Partial consolidation in the left lower lobe. Nodular focus of airspace disease in the lingula. No pneumothorax.  Upper Abdomen: Postsurgical changes of the stomach. High density foci in the right upper quadrant may reflect large calcified gallstones. This is incompletely visualized.  Musculoskeletal: Degenerative changes. Moderate compression fracture of T12 with estimated 40% loss of height anteriorly. Some gas in the vertebral body.  IMPRESSION: 1. Moderate right-sided pleural effusion and trace left pleural effusion. Multifocal consolidations within the bilateral lungs, suspicious for multifocal pneumonia. Imaging follow-up to resolution recommended to exclude underlying neoplasm. Abrupt termination of the distal right bronchus with apparent fluid or debris in the distal bronchus. 2. Mild mediastinal adenopathy, likely reactive. 3. Partially visualized density in the right upper quadrant of the abdomen, likely gallstones. 4. Moderate compression fracture of T12, progressed since radiograph April 2018. 5. Mild mediastinal adenopathy, probably reactive  Aortic Atherosclerosis (ICD10-I70.0) and Emphysema (ICD10-J43.9).   Electronically Signed   By: Donavan Foil M.D.   On: 05/05/2017 00:26  Given history physical exam consistent with possible hospital-acquired pneumonia with multifocal pneumonia noted on CT scan of the chest patient received IV Vancomycin and Cefepime. Patient discussed with Dr. Joseph Art or hospital admission for further evaluation and management. Patient's family informed of all clinical findings   Gregor Hams, MD 05/05/17 [phone]

## 2017-05-05 NOTE — Progress Notes (Signed)
Patient stable, O2 97% on 2 liters. Resting in bed. No complaints or signs of distress. Continue to monitor.

## 2017-05-05 NOTE — Progress Notes (Signed)
[place] [facility] [place] NAME: Corey Clark    MR#:  [id number]  DATE OF BIRTH:  [birth year]  SUBJECTIVE:  CHIEF COMPLAINT:   Chief Complaint  Patient presents with  . Cough     Recently treated for pneumonia and lives in a nursing home, sent back with worsening respiratory failure. Noted to have some pleural effusion more on the right side. He is lethargic and have Alzheimer's so not able to give a review of system.  REVIEW OF SYSTEMS:   Terminal dementia so not able to give a review of system.  ROS  DRUG ALLERGIES:   Allergies  Allergen Reactions  . Tetracycline Other (See Comments)    Sick to stomach  . Tetracyclines & Related Other (See Comments)    Patient not sure it's been years since he's taken med    VITALS:  Blood pressure (!) 95/54, pulse 90, temperature (!) 97 F (36.1 C), temperature source Axillary, resp. rate (!) 22, height 5\' 10"  (1.778 m), weight 64 kg (141 lb 1.5 oz), SpO2 97 %.  PHYSICAL EXAMINATION:  GENERAL:  83 y.o.-year-old patient lying in the bed with no acute distress.  EYES: Pupils equal, round, reactive to light and accommodation. No scleral icterus. Extraocular muscles intact.  HEENT: Head atraumatic, normocephalic. Oropharynx and nasopharynx clear.  NECK:  Supple, no jugular venous distention. No thyroid enlargement, no tenderness.  LUNGS: Decreased breath sounds bilaterally, no wheezing, some crepitation. No use of accessory muscles of respiration.  CARDIOVASCULAR: S1, S2 normal. No murmurs, rubs, or gallops.  ABDOMEN: Soft, nontender, nondistended. Bowel sounds present. No organomegaly or mass.  EXTREMITIES: No pedal edema, cyanosis, or clubbing.  NEUROLOGIC: Cranial nerves II through XII are intact. Muscle strength 3-4/5 in all extremities. Sensation intact. Gait not checked.  PSYCHIATRIC: The patient is alert and oriented x 0.  SKIN: No obvious rash, lesion, or ulcer.   Physical Exam LABORATORY  PANEL:   CBC  Recent Labs Lab 05/14/2017 1957  WBC 6.5  HGB 11.9*  HCT 35.4*  PLT 132*   ------------------------------------------------------------------------------------------------------------------  Chemistries   Recent Labs Lab 04/22/2017 1957  NA 140  K 3.5  CL 103  CO2 30  GLUCOSE 116*  BUN 26*  CREATININE 1.01  CALCIUM 8.3*  AST 52*  ALT 24  ALKPHOS 118  BILITOT 1.0   ------------------------------------------------------------------------------------------------------------------  Cardiac Enzymes  Recent Labs Lab 05/05/17 0546 05/05/17 1116  TROPONINI <0.03 0.11*   ------------------------------------------------------------------------------------------------------------------  RADIOLOGY:  Ct Chest W Contrast  Result Date: 05/05/2017 CLINICAL DATA:  Cough and fever EXAM: CT CHEST WITH CONTRAST TECHNIQUE: Multidetector CT imaging of the chest was performed during intravenous contrast administration. CONTRAST:  74mL ISOVUE-300 IOPAMIDOL (ISOVUE-300) INJECTION 61% COMPARISON:  05/11/2017, 01/14/2017, 04/26/2014 FINDINGS: Cardiovascular: Non aneurysmal aorta. Aortic atherosclerosis. Coronary artery calcification. Heart size within normal limits. No large pericardial effusion is seen. Mediastinum/Nodes: Mild mediastinal adenopathy, a right pretracheal lymph node measures 15 mm. Midline trachea. No thyroid mass. Mild fluid and air distention of the esophagus. Abrupt termination of the distal right bronchus in the right bronchus. Lungs/Pleura: Mild emphysematous disease. Moderate right-sided pleural effusion and trace left pleural effusion. Multifocal consolidations within the bilateral upper lobes, right middle lobe, and right lower lobe. Partial consolidation in the left lower lobe. Nodular focus of airspace disease in the lingula. No pneumothorax. Upper Abdomen: Postsurgical changes of the stomach. High density foci in the right upper quadrant may reflect large  calcified gallstones. This is incompletely visualized. Musculoskeletal:  Degenerative changes. Moderate compression fracture of T12 with estimated 40% loss of height anteriorly. Some gas in the vertebral body. IMPRESSION: 1. Moderate right-sided pleural effusion and trace left pleural effusion. Multifocal consolidations within the bilateral lungs, suspicious for multifocal pneumonia. Imaging follow-up to resolution recommended to exclude underlying neoplasm. Abrupt termination of the distal right bronchus with apparent fluid or debris in the distal bronchus. 2. Mild mediastinal adenopathy, likely reactive. 3. Partially visualized density in the right upper quadrant of the abdomen, likely gallstones. 4. Moderate compression fracture of T12, progressed since radiograph April 2018. 5. Mild mediastinal adenopathy, probably reactive Aortic Atherosclerosis (ICD10-I70.0) and Emphysema (ICD10-J43.9). Electronically Signed   By: Donavan Foil M.D.   On: 05/05/2017 00:26   Dg Chest Port 1 View  Result Date: 05/12/2017 CLINICAL DATA:  Wet cough EXAM: PORTABLE CHEST 1 VIEW COMPARISON:  01/14/2017 FINDINGS: Bilateral pleural effusions, small on the left and moderate on the right. Consolidation at the right lung base. Stable cardiomediastinal silhouette. Increased opacity within the left lung apex. Aortic atherosclerosis. No pneumothorax. IMPRESSION: 1. Bilateral pleural effusions, small on the left, moderate on the right and appears increased on the right side. 2. Right basilar consolidation persists and may reflect atelectasis or pneumonia. Increased density at the left greater than right lung apex, cannot exclude additional infiltrates. Electronically Signed   By: Donavan Foil M.D.   On: 04/14/2017 20:24    ASSESSMENT AND PLAN:   Active Problems:   HCAP (healthcare-associated pneumonia)  * HCAP   Pleural effusion   Acute respiratory failure with hypoxia on admission    Broad-spectrum antibiotics    Ultrasound-guided thoracentesis is planned for tomorrow. Required BiPAP support of admission, currently on nasal cannula oxygen.  *   Elevated troponin    Demand Ischemia secondary to infection, echocardiogram is done.  * Possible acute diastolic CHF   Check echocardiogram, on Lasix.     All the records are reviewed and case discussed with Care Management/Social Workerr. Management plans discussed with the patient, family and they are in agreement.  CODE STATUS: DO NOT RESUSCITATE   TOTAL TIME TAKING CARE OF THIS PATIENT: 35  minutes.     POSSIBLE D/C IN 2-3 DAYS, DEPENDING ON CLINICAL CONDITION.   Vaughan Basta M.D on 05/05/2017   Between 7am to 6pm - Pager - [phone]  After 6pm go to [email or web address] - password [email or web address] [facility]  Office  [phone]  CC: Primary care physician; Venia Carbon, MD  Note: This dictation was prepared with Dragon dictation along with smaller phrase technology. Any transcriptional errors that result from this process are unintentional.

## 2017-05-05 NOTE — Progress Notes (Signed)
bipap on standby. Patient on nasal cannula with saturation at 96%.  Per NP [place], will continue to monitor for now

## 2017-05-05 NOTE — Progress Notes (Signed)
Pt confused and trying to get out of bed. Has to be frequently reoriented. Low BP with sepsis. Had an additional 500 ml fluid bolus.

## 2017-05-05 NOTE — ED Notes (Signed)
Pt's o2sat noted to be 86% on 2L, increased to 3L.  Pt noted to be mouth breathing

## 2017-05-05 NOTE — Progress Notes (Signed)
Pharmacy Antibiotic Note  Corey Clark is a 83 y.o. male admitted on 04/18/2017 with pneumonia.  Pharmacy has been consulted for vanc/cefepime dosing.  Plan: Patient received vanc 1g IV and cefepime 1g IV x 1 in ED  Will start vanc 1.25g IV q24h w/ 8 hour stack dose. Will draw vanc trough 7/25 @ 0800 prior to 4th dose Will start cefepime 2g IV q24h  Ke 0.042 T1/2 16 hours ~ 24 hours for ease of administration and increasing dose to 1.25g Goal trough 15 - 20 mcg/mL  Height: 5\' 10"  (177.8 cm) Weight: 141 lb 1.5 oz (64 kg) IBW/kg (Calculated) : 73  Temp (24hrs), Avg:98.1 F (36.7 C), Min:98.1 F (36.7 C), Max:98.1 F (36.7 C)   Recent Labs Lab 05/14/2017 1957 04/21/2017 2002  WBC 6.5  --   CREATININE 1.01  --   LATICACIDVEN  --  1.1    Estimated Creatinine Clearance: 44.9 mL/min (by C-G formula based on SCr of 1.01 mg/dL).    Allergies  Allergen Reactions  . Tetracycline Other (See Comments)    Sick to stomach  . Tetracyclines & Related Other (See Comments)    Patient not sure it's been years since he's taken med    Thank you for allowing pharmacy to be a part of this patient's care.  Tobie Lords, PharmD, BCPS Clinical Pharmacist 05/05/2017

## 2017-05-05 NOTE — Progress Notes (Signed)
RN spoke with Dr. Mortimer Fries over the phone and made MD aware of 3rd troponin result of 0.11 and made MD aware that patient has audible crackles intermittently at times.  MD acknowledged both and gave no new orders at this time.

## 2017-05-05 NOTE — Progress Notes (Signed)
Chaplain received a RRT for patient. [place] visited bedside with patient and wife. Provided ministry of presence and words of encouragement.     05/05/17 1730  Clinical Encounter Type  Visited With Patient;Patient and family together;Health care provider  Visit Type Initial;Spiritual support  Referral From Nurse  Consult/Referral To Chaplain  Spiritual Encounters  Spiritual Needs Other (Comment)

## 2017-05-05 NOTE — ED Notes (Signed)
Pt's o2sat noted to be 88% on 3L, increased to 4L.

## 2017-05-05 NOTE — ED Notes (Signed)
Pt's o2sat noted to be 88% on 1L of o2, increased to 2L

## 2017-05-06 ENCOUNTER — Telehealth: Payer: Self-pay | Admitting: *Deleted

## 2017-05-06 DIAGNOSIS — F0280 Dementia in other diseases classified elsewhere without behavioral disturbance: Secondary | ICD-10-CM

## 2017-05-06 DIAGNOSIS — Z7189 Other specified counseling: Secondary | ICD-10-CM

## 2017-05-06 DIAGNOSIS — J189 Pneumonia, unspecified organism: Secondary | ICD-10-CM

## 2017-05-06 DIAGNOSIS — L8990 Pressure ulcer of unspecified site, unspecified stage: Secondary | ICD-10-CM | POA: Insufficient documentation

## 2017-05-06 DIAGNOSIS — Z515 Encounter for palliative care: Secondary | ICD-10-CM

## 2017-05-06 DIAGNOSIS — G301 Alzheimer's disease with late onset: Secondary | ICD-10-CM

## 2017-05-06 LAB — PROCALCITONIN: PROCALCITONIN: 1.18 ng/mL

## 2017-05-06 LAB — CBC
HCT: 38.6 % — ABNORMAL LOW (ref 40.0–52.0)
HEMOGLOBIN: 12.8 g/dL — AB (ref 13.0–18.0)
MCH: 30.6 pg (ref 26.0–34.0)
MCHC: 33.3 g/dL (ref 32.0–36.0)
MCV: 92 fL (ref 80.0–100.0)
PLATELETS: 160 10*3/uL (ref 150–440)
RBC: 4.19 MIL/uL — AB (ref 4.40–5.90)
RDW: 14.4 % (ref 11.5–14.5)
WBC: 12.3 10*3/uL — ABNORMAL HIGH (ref 3.8–10.6)

## 2017-05-06 LAB — BASIC METABOLIC PANEL
Anion gap: 10 (ref 5–15)
BUN: 39 mg/dL — AB (ref 6–20)
CO2: 26 mmol/L (ref 22–32)
CREATININE: 1.38 mg/dL — AB (ref 0.61–1.24)
Calcium: 8.5 mg/dL — ABNORMAL LOW (ref 8.9–10.3)
Chloride: 103 mmol/L (ref 101–111)
GFR, EST AFRICAN AMERICAN: 51 mL/min — AB (ref >60)
GFR, EST NON AFRICAN AMERICAN: 44 mL/min — AB (ref >60)
Glucose, Bld: 146 mg/dL — ABNORMAL HIGH (ref 65–99)
POTASSIUM: 4 mmol/L (ref 3.5–5.1)
SODIUM: 139 mmol/L (ref 135–145)

## 2017-05-06 LAB — GLUCOSE, CAPILLARY: GLUCOSE-CAPILLARY: 91 mg/dL (ref 65–99)

## 2017-05-06 LAB — URINE CULTURE

## 2017-05-06 MED ORDER — SODIUM CHLORIDE 0.9 % IV BOLUS (SEPSIS)
500.0000 mL | Freq: Once | INTRAVENOUS | Status: AC
  Administered 2017-05-06: 500 mL via INTRAVENOUS

## 2017-05-06 MED ORDER — IPRATROPIUM-ALBUTEROL 0.5-2.5 (3) MG/3ML IN SOLN
3.0000 mL | Freq: Four times a day (QID) | RESPIRATORY_TRACT | Status: DC
  Administered 2017-05-06 – 2017-05-08 (×7): 3 mL via RESPIRATORY_TRACT
  Filled 2017-05-06 (×9): qty 3

## 2017-05-06 MED ORDER — IPRATROPIUM-ALBUTEROL 0.5-2.5 (3) MG/3ML IN SOLN
3.0000 mL | RESPIRATORY_TRACT | Status: DC | PRN
  Administered 2017-05-07: 3 mL via RESPIRATORY_TRACT

## 2017-05-06 MED ORDER — SODIUM CHLORIDE 0.9 % IV SOLN
INTRAVENOUS | Status: AC
  Administered 2017-05-06: 75 mL/h via INTRAVENOUS
  Administered 2017-05-06: 11:00:00 via INTRAVENOUS

## 2017-05-06 NOTE — Evaluation (Signed)
Clinical/Bedside Swallow Evaluation Patient Details  Name: Corey Clark MRN: [id number] Date of Birth: 04/06/1925  Today's Date: 05/06/2017 Time: SLP Start Time (ACUTE ONLY): 0913 SLP Stop Time (ACUTE ONLY): 0951 SLP Time Calculation (min) (ACUTE ONLY): 38 min  Past Medical History:  Past Medical History:  Diagnosis Date  . Alzheimer's dementia   . BPH (benign prostatic hypertrophy)   . Colon polyps   . GERD (gastroesophageal reflux disease)   . Neuropathy due to drug [facility])    chemo for stomach cancer  . Osteoarthrosis involving, or with mention of more than one site, but not specified as generalized, multiple sites   . SCC (squamous cell carcinoma), face 2014   left preauricular---[facility]  . Stomach cancer ([place]) 1999   MALT lymphoma --surgery then chemo. Bad infection after with 1 month hospitalization   Past Surgical History:  Past Surgical History:  Procedure Laterality Date  . CATARACT EXTRACTION, BILATERAL  2002, 2003  . [place]  . PARTIAL GASTRECTOMY  1999   2 procedures for stomach cancer  . ROTATOR CUFF REPAIR  1991/1998   left and right done  . TENDON RELEASE Left 6/15   Dr Jefm Bryant   HPI:      Assessment / Plan / Recommendation Clinical Impression  pt presented with a moderate oral pharyngeal dysphagia characterized by prolonged mastication of regular and dys 3 texures. pt presented with oral holding and pocketing of these textures with inability to swallow with verbal cues. pt utilized a liquid wash with slp assist to clear oral cavity of bolus..   pt had cough and throat clear, immediate and delayed, with thin and nectar thick trials. Slp recommends dysphagia 2 with moistened foods and honey thick liquids to minimize risk of aspiration at this time. MD notified and slp attempted to notify NSG, however unavailable.  [place] left in room for education of downgraded diet.  SLP Visit Diagnosis: Dysphagia, oropharyngeal phase (R13.12)     Aspiration Risk  Moderate aspiration risk    Diet Recommendation Dysphagia 2 (Fine chop);Honey-thick liquid;Other (Comment) (Moisten fods well)   Liquid Administration via: Cup;Spoon;No straw Medication Administration: Crushed with puree Supervision: Staff to assist with self feeding Compensations: Slow rate;Small sips/bites;Follow solids with liquid Postural Changes: Seated upright at 90 degrees    Other  Recommendations Oral Care Recommendations: Oral care BID Other Recommendations: Remove water pitcher   Follow up Recommendations        Frequency and Duration min 4x/week  1 week       Prognosis Prognosis for Safe Diet Advancement: Fair Barriers to Reach Goals: Cognitive deficits      Swallow Study   General Date of Onset: 05/05/17 Type of Study: Bedside Swallow Evaluation Diet Prior to this Study: Regular;Thin liquids Temperature Spikes Noted: No Respiratory Status: Nasal cannula History of Recent Intubation: No Behavior/Cognition: Cooperative;Pleasant mood;Confused;Requires cueing Oral Cavity Assessment: Within Functional Limits Oral Care Completed by SLP: No Oral Cavity - Dentition: Missing dentition Vision: Functional for self-feeding Self-Feeding Abilities: Needs assist Patient Positioning: Upright in bed Baseline Vocal Quality: Breathy Volitional Cough: Weak Volitional Swallow: Able to elicit    Oral/Motor/Sensory Function Overall Oral Motor/Sensory Function: Within functional limits   Ice Chips Ice chips: Not tested   Thin Liquid Thin Liquid: Impaired Presentation: Cup;Spoon;Straw Oral Phase Impairments: Reduced lingual movement/coordination Oral Phase Functional Implications: Oral holding;Prolonged oral transit Pharyngeal  Phase Impairments: Suspected delayed Swallow;Multiple swallows;Wet Vocal Quality;Throat Clearing - Immediate;Cough - Immediate;Cough - Delayed    Nectar  Thick Nectar Thick Liquid: Impaired Presentation: Cup;Spoon Oral Phase  Impairments: Reduced lingual movement/coordination Oral phase functional implications: Oral holding Pharyngeal Phase Impairments: Cough - Delayed   Honey Thick Honey Thick Liquid: Within functional limits Presentation: Cup;Spoon   Puree Puree: Within functional limits Presentation: Spoon   Solid   GO   Solid: Impaired Presentation: Spoon Oral Phase Impairments: Reduced lingual movement/coordination Oral Phase Functional Implications: Oral holding;Oral residue Pharyngeal Phase Impairments: Suspected delayed Swallow Other Comments: pt had prolonged mastication with regular and Dys 3 bolus trials. pt with no ssx aspiration with puree or dysphagia 2 with well moistened foods.         Corey Clark 05/06/2017,10:32 AM

## 2017-05-06 NOTE — Progress Notes (Signed)
Chaplain made a follow up visit with pt that the on-call chaplain had attended to when she responded to a RR for pt. Pt was sitting at the bed, CH spoke to pt but could hardly understand the pt's responses. Pt appeared calm and relaxed, but his responses to CH's questions were incomprehensible. [place] offered ministry of presence and spiritual support.   05/06/17 1000  Clinical Encounter Type  Visited With Patient  Visit Type Initial;Follow-up;Spiritual support  Referral From Chaplain  Consult/Referral To Chaplain  Spiritual Encounters  Spiritual Needs Other (Comment)

## 2017-05-06 NOTE — Progress Notes (Signed)
Initial Nutrition Assessment  DOCUMENTATION CODES:   Severe malnutrition in context of chronic illness  INTERVENTION:  Continue MVI w/ Minerals Honey Thick Mighty Shake BID between meals, provides 200 calories and 7 grams of protein Magic cup TID with meals, each supplement provides 290 kcal and 9 grams of protein Monitor for GOC discussion, PMT consulted  NUTRITION DIAGNOSIS:   Malnutrition related to chronic illness as evidenced by severe depletion of muscle mass, severe depletion of body fat.  GOAL:   Patient will meet greater than or equal to 90% of their needs  MONITOR:   PO intake, I & O's, Labs, Weight trends, Supplement acceptance  REASON FOR ASSESSMENT:   Malnutrition Screening Tool    ASSESSMENT:   83 yo male with history of alzheimer's dementia, BPH, GERD, osteoarthritis, squamous cell carcinoma, stomach cancer, malt lymphoma, hx partial gastrectomy 1999, presented with bilateral PNA--HCAP, and pleural effusion  Rapid Response called yesterday evening for low BP, low 02 sats 122mL UOP documented yesterday 18#/11.3% severe wt loss over 6 months per chart. Wife states his weight has been down but is unsure of how much. He was in the memory care unit at [facility], but wife stopped eating with him after, so he was unsure of how much he was eating or what he was eating. He is unable to provide any history. She states he normally consumes boost with ice cream, but patient is currently on NDD2-honey thick, unable to consume that. No other complaints at this time.  Suspect patient will be unable to meet his needs given dementia and on NDD2-honey thick diet. Discussed with RN. Nutrition-Focused physical exam completed. Findings are severe fat depletion, severe muscle depletion, and mild edema.  Medications reviewed and include:  Marinol, Vitamin B12, Solumedrol 40mg  BID, Remeron, Miralax Labs reviewed  Diet Order:  DIET DYS 2 Room service appropriate? Yes; Fluid  consistency: Honey Thick  Skin:  Wound (see comment) (Stage I to coccyx)  Last BM:  05/05/2017 (type 6)  Height:   Ht Readings from Last 1 Encounters:  04/18/2017 5\' 10"  (1.778 m)    Weight:   Wt Readings from Last 1 Encounters:  04/16/2017 141 lb 1.5 oz (64 kg)    Ideal Body Weight:  75.45 kg  BMI:  Body mass index is 20.24 kg/m.  Estimated Nutritional Needs:   Kcal:  1580-1713 calories (MSJ x1.2-1.3)  Protein:  96-108 grams (1.5-1.7g/kg)  Fluid:  1.6-1.8L  EDUCATION NEEDS:   Education needs addressed  Satira Anis. Talise Sligh, MS, RD LDN Inpatient Clinical Dietitian Pager [phone]

## 2017-05-06 NOTE — NC FL2 (Signed)
[place] LEVEL OF CARE SCREENING TOOL     IDENTIFICATION  Patient Name: Corey Clark Birthdate: [birth year] Sex: male Admission Date (Current Location): 04/17/2017  [place] and [place] Number:  Engineering geologist and Address:  [facility], [address] [place], [place] [address]      Provider Number: [id number]  Attending Physician Name and Address:  Vaughan Basta, *  Relative Name and Phone Number:       Current Level of Care: Hospital Recommended Level of Care: [place], Memory Care Prior Approval Number:    Date Approved/Denied:   PASRR Number:    Discharge Plan: Domiciliary (Rest home) (Memory Care )    Current Diagnoses: Patient Active Problem List   Diagnosis Date Noted  . HCAP (healthcare-associated pneumonia) 05/05/2017  . Lobar pneumonia ([place]) 01/14/2017  . Mood disorder ([place]) 10/23/2016  . Mild malnutrition ([place]) 07/27/2016  . Alzheimer's dementia, late onset 08/31/2015  . 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 Cough 07/25/2015  . Advance directive discussed with patient 01/17/2015  . Non-allergic rhinitis 06/29/2014  . Abnormality of lung on CXR 06/29/2014  . Trigger finger 04/07/2014  . [facility] DJD(carpometacarpal degenerative joint disease), localized primary 07/13/2013  . H/O malignant neoplasm of skin 06/29/2013  . BPH (benign prostatic hypertrophy)   . Routine general medical examination at a health care facility 05/30/2012  . Other constipation 11/26/2011  . GERD (gastroesophageal reflux disease)   . Osteoarthritis, multiple sites   . Neuropathy due to drug (HCC)     Orientation RESPIRATION BLADDER Height & Weight      (Disoriented X4)  O2 (2 Liters Oxygen ) Incontinent Weight: 141 lb 1.5 oz (64 kg) Height:  5\' 10"  (177.8 cm)  BEHAVIORAL SYMPTOMS/MOOD NEUROLOGICAL BOWEL NUTRITION STATUS   (none)  (none ) Incontinent Diet (Dysphagia 2 (Fine chop);Honey-thick liquid;Other (Comment)  (Moisten fods well) )  AMBULATORY STATUS COMMUNICATION OF NEEDS Skin   Extensive Assist Verbally PU Stage and Appropriate Care (Pressure Ulcer Stage 1: Coccyx. )                       Personal Care Assistance Level of Assistance  Bathing, Feeding, Dressing Bathing Assistance: Limited assistance Feeding assistance: Independent Dressing Assistance: Limited assistance     Functional Limitations Info  Sight, Hearing, Speech Sight Info: Adequate Hearing Info: Adequate Speech Info: Adequate    SPECIAL CARE FACTORS FREQUENCY                       Contractures      Additional Factors Info  Code Status, Allergies Code Status Info:  (DNR ) Allergies Info:  (Tetracycline, Tetracyclines & Related)           Current Medications (05/06/2017):  This is the current hospital active medication list Current Facility-Administered Medications  Medication Dose Route Frequency Provider Last Rate Last Dose  . 0.9 %  sodium chloride infusion  250 mL Intravenous PRN Pyreddy, Pavan, MD      . 0.9 %  sodium chloride infusion   Intravenous Continuous Vaughan Basta, MD 75 mL/hr at 05/06/17 1031    . acetaminophen (TYLENOL) tablet 650 mg  650 mg Oral Q6H PRN Saundra Shelling, MD       Or  . acetaminophen (TYLENOL) suppository 650 mg  650 mg Rectal Q6H PRN Pyreddy, Pavan, MD      . benzonatate (TESSALON) capsule 100 mg  100 mg Oral TID PRN Pyreddy,  Pavan, MD      . budesonide (PULMICORT) nebulizer solution 0.5 mg  0.5 mg Nebulization BID Flora Lipps, MD   0.5 mg at 05/06/17 0747  . ceFEPIme (MAXIPIME) 2 g in dextrose 5 % 50 mL IVPB  2 g Intravenous Q24H Saundra Shelling, MD   Stopped at 05/05/17 2135  . cyanocobalamin tablet 500 mcg  500 mcg Oral Daily Saundra Shelling, MD   500 mcg at 05/06/17 0754  . dronabinol (MARINOL) capsule 2.5 mg  2.5 mg Oral BID AC Pyreddy, Reatha Harps, MD   2.5 mg at 05/06/17 0755  . enoxaparin (LOVENOX) injection 40 mg  40 mg Subcutaneous Q24H Saundra Shelling, MD    40 mg at 05/06/17 0755  . guaiFENesin (ROBITUSSIN) 100 MG/5ML solution 100 mg  5 mL Oral Q4H PRN Varughese, Bincy S, NP      . ipratropium (ATROVENT) 0.03 % nasal spray 2 spray  2 spray Each Nare TID Saundra Shelling, MD   2 spray at 05/06/17 1030  . ipratropium-albuterol (DUONEB) 0.5-2.5 (3) MG/3ML nebulizer solution 3 mL  3 mL Nebulization Q6H Vaughan Basta, MD   3 mL at 05/06/17 1331  . ipratropium-albuterol (DUONEB) 0.5-2.5 (3) MG/3ML nebulizer solution 3 mL  3 mL Nebulization Q4H PRN Vaughan Basta, MD      . lactulose (CHRONULAC) 10 GM/15ML solution 20 g  20 g Oral Daily PRN Pyreddy, Reatha Harps, MD      . memantine (NAMENDA) tablet 5 mg  5 mg Oral BID Saundra Shelling, MD   5 mg at 05/06/17 0755  . methylPREDNISolone sodium succinate (SOLU-MEDROL) 40 mg/mL injection 40 mg  40 mg Intravenous Q12H Flora Lipps, MD   40 mg at 05/06/17 0754  . mirtazapine (REMERON) tablet 15 mg  15 mg Oral QHS Saundra Shelling, MD   15 mg at 05/05/17 2105  . multivitamin with minerals tablet 1 tablet  1 tablet Oral Daily Saundra Shelling, MD   1 tablet at 05/06/17 0754  . ondansetron (ZOFRAN) tablet 4 mg  4 mg Oral Q6H PRN Pyreddy, Reatha Harps, MD       Or  . ondansetron (ZOFRAN) injection 4 mg  4 mg Intravenous Q6H PRN Pyreddy, Pavan, MD      . polyethylene glycol (MIRALAX / GLYCOLAX) packet 17 g  17 g Oral Daily Saundra Shelling, MD   Stopped at 05/06/17 1000  . polyvinyl alcohol (LIQUIFILM TEARS) 1.4 % ophthalmic solution 1 drop  1 drop Both Eyes BID Saundra Shelling, MD   1 drop at 05/06/17 1030  . sodium chloride 0.9 % bolus 500 mL  500 mL Intravenous Once Vaughan Basta, MD 250 mL/hr at 05/06/17 1215 500 mL at 05/06/17 1215  . sodium chloride flush (NS) 0.9 % injection 3 mL  3 mL Intravenous Q12H Pyreddy, Pavan, MD   3 mL at 05/06/17 1030  . sodium chloride flush (NS) 0.9 % injection 3 mL  3 mL Intravenous PRN Pyreddy, Reatha Harps, MD      . tamsulosin (FLOMAX) capsule 0.4 mg  0.4 mg Oral Daily Pyreddy, Pavan,  MD   0.4 mg at 05/06/17 3235     Discharge Medications: Please see discharge summary for a list of discharge medications.  Relevant Imaging Results:  Relevant Lab Results:   Additional Information  (SSN: [id number])  [place], Veronia Beets, LCSW

## 2017-05-06 NOTE — Progress Notes (Signed)
Palliative:  Full note to follow. I have spoken with Corey Clark's wife at bedside. We took time to discuss his progressing dementia along with his poor appetite, dysphagia, falls, and recurrent pneumonias and how these connect to his dementia and that unfortunately these problems will only become worse. We discussed his health complicated by heart failure. We were able to discuss QOL and her hopes for him. Corey Clark is understandably very tearful as I believe this is the first time she has been able to put together the big picture of his dementia, poor prognosis, and that this is not going to get better. We discussed aggressive care vs more comfort care. At this time we will continue supportive care but will hold on thoracentesis and we will discuss more tomorrow. She also does not want him to suffer and desires comfort for him. She is currently calling her close friend to be with her for support. Chaplain notified for support as well. RN and Dr. Anselm Jungling updated.   Corey Sill, NP Palliative Medicine Team Pager # [phone] (M-F 8a-5p) Team Phone # [phone] (Nights/Weekends)

## 2017-05-06 NOTE — Telephone Encounter (Signed)
Currently inpatient.

## 2017-05-06 NOTE — Clinical Social Work Note (Signed)
Clinical Social Work Assessment  Patient Details  Name: Corey Clark MRN: [id number] Date of Birth: 09-02-1925  Date of referral:  05/06/17               Reason for consult:  Other (Comment Required) (From [place] )                Permission sought to share information with:  Chartered certified accountant granted to share information::  Yes, Verbal Permission Granted  Name::      Corey Clark::     Relationship::     Contact Information:     Housing/Transportation Living arrangements for the past 2 months:  [place] of Information:  Facility, Spouse Patient Interpreter Needed:  None Criminal Activity/Legal Involvement Pertinent to Current Situation/Hospitalization:  No - Comment as needed Significant Relationships:  Spouse Lives with:  Facility Resident Do you feel safe going back to the place where you live?    Need for family participation in patient care:  Yes (Comment)  Care giving concerns:  Patient is a resident at [facility].    Social Worker assessment / plan:  Holiday representative ([place]) reviewed chart and noted that patient is from [employer] care. CSW contacted [facility] admissions coordinator at [facility] memory care to get additional information. Per Corey Clark patient is on the ALF side of the memory care unit at [facility] and can return when stable. Per Corey Clark patient is weak and doesn't walk a lot at baseline. Per Corey Clark patient is on room air at baseline however she stated that he can return with oxygen if needed. CSW met with patient and his wife Corey Clark was at bedside. Patient did not participate in assessment. Wife was tearful after meeting with the palliative care team. CSW provided emotional support. Wife is agreeable for patient to return to [facility] ALF memory care. CSW will continue to follow and assist as needed.   Employment status:  Disabled (Comment on whether or  not currently receiving Disability) Insurance information:  Managed Medicare PT Recommendations:  Not assessed at this time Information / Referral to community resources:  Other (Comment Required) (Patient will return to [place]. )  Patient/Family's Response to care:  Patient's wife is agreeable for patient to return to [place].   Patient/Family's Understanding of and Emotional Response to Diagnosis, Current Treatment, and Prognosis:  Patient's wife was tearful because of meeting with palliative care and discussing end of life. CSW provided emotional support.   Emotional Assessment Appearance:  Appears stated age Attitude/Demeanor/Rapport:  Unable to Assess Affect (typically observed):  Unable to Assess Orientation:  Oriented to Self, Fluctuating Orientation (Suspected and/or reported Sundowners) Alcohol / Substance use:  Not Applicable Psych involvement (Current and /or in the community):  No (Comment)  Discharge Needs  Concerns to be addressed:  Discharge Planning Concerns Readmission within the last 30 days:  No Current discharge risk:  Physical Impairment, Chronically ill, Cognitively Impaired, Dependent with Mobility Barriers to Discharge:  Continued Medical Work up   [employer], Corey Beets, LCSW 05/06/2017, 2:24 PM

## 2017-05-06 NOTE — Progress Notes (Signed)
   05/06/17 1500  Clinical Encounter Type  Visited With Family  Visit Type Follow-up;Psychological support;Spiritual support  Referral From Nurse  Spiritual Encounters  Spiritual Needs Emotional;Grief support   Chaplain paged to come and provide spiritual and emotional support for patient's wife who was distraught after Palliative GOC meeting and SW visit about poor patient prognosis; [place] will continue to provide emotional support and follow-up.

## 2017-05-06 NOTE — Consult Note (Signed)
Consultation Note Date: 05/06/2017   Patient Name: Corey Clark  DOB: May 26, 1925  MRN: [id number]  Age / Sex: 83 y.o., male  PCP: Venia Carbon, MD Referring Physician: Vaughan Basta, *  Reason for Consultation: Establishing goals of care  HPI/Patient Profile: 83 y.o. male  with past medical history of Alzheimer's dementia (currently resides memory care unit), prostate hypertrophy, GERD, osteoarthritis, squamous cell carcinoma, stomach cancer (1999 treated with surgery and chemo) admitted on 05/02/2017 with altered mental status and continued cough - off antibiotics for pneumonia treatment for 3 weeks. Patient status very tenuous and I was called by RN to assist with education and Corey Clark conversation d/t advanced dementia complicated by acute pneumonia/infection.  Clinical Assessment and Goals of Care: I have spoken with Corey Clark's wife at bedside. We took time to discuss his progressing dementia along with his poor appetite, dysphagia, falls, and recurrent pneumonias and how these connect to his dementia and that unfortunately these problems will only become worse. We discussed his health complicated by heart failure and now with pleural effusion but not stable enough to be drained (hypotensive). We were able to discuss QOL and her hopes for him. Corey Clark is understandably very tearful as I believe this is the first time she has been able to put together the big picture of his dementia, poor prognosis, and that this is not going to get better. She talks of "now I understand" and tells me about a neighbor with dementia that died after "choking." She did not realize the connection between his dysphagia and dementia and this is only going to lead to more complications. We discussed aggressive care vs more comfort care. At this time we will continue supportive care but will hold on thoracentesis  and we will discuss more tomorrow. She also does not want him to suffer and desires comfort for him. She is currently calling her close friend to be with her for support. Chaplain notified for support as well. RN and Dr. Anselm Jungling updated.     Primary Decision Maker NEXT OF KIN wife Corey Clark    SUMMARY OF RECOMMENDATIONS   - DNR confirmed - Lots of education regarding expectations and prognosis with dementia - Will continue to discuss [place] tomorrow  Code Status/Advance Care Planning:  DNR   Symptom Management:   Dyspnea: If medical interventions optimized (nebs, oxygen) would recommend low dose fentanyl 12.5-25 mcg IV prn for comfort.   Agitation: Could consider haldol 1 mg IV prn.   Palliative Prophylaxis:   Aspiration, Bowel Regimen, Delirium Protocol, Frequent Pain Assessment, Oral Care and Turn Reposition  Psycho-social/Spiritual:   Desire for further Chaplaincy support:yes  Additional Recommendations: Caregiving  Support/Resources, Education on Hospice and Grief/Bereavement Support  Prognosis:   Prognosis very poor. Wife open to comfort measures if needed but needs time to process this information.   Discharge Planning: To Be Determined      Primary Diagnoses: Present on Admission: . HCAP (healthcare-associated pneumonia)   I have reviewed the medical record, interviewed the patient  and family, and examined the patient. The following aspects are pertinent.  Past Medical History:  Diagnosis Date  . Alzheimer's dementia   . BPH (benign prostatic hypertrophy)   . Colon polyps   . GERD (gastroesophageal reflux disease)   . 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 Osteoarthrosis involving, or with mention of more than one site, but not specified as generalized, multiple sites   . SCC (squamous cell carcinoma), face 2014   left preauricular---[facility]  . Stomach cancer (Home) 1999   MALT lymphoma --surgery then chemo. Bad infection after  with 1 month hospitalization   Social History   Social History  . Marital status: Married    Spouse name: N/A  . Number of children: 3  . Years of education: N/A   Occupational History  . 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 Smoking status: Former Smoker    Quit date: 10/15/1950  . Smokeless tobacco: Never Used  . Alcohol use Yes     Comment: rare wine  . Drug use: No  . Sexual activity: No   Other Topics Concern  . None   Social History Narrative   3 children in [place]      Has living will    Wife, then son, Corey Clark., to be health care POA   Would accept resuscitation attempts   Not sure about tube feeds-- probably doesn't want if cognitively unaware   Family History  Problem Relation Age of Onset  . Cancer Brother   . Heart disease Neg Hx   . Hypertension Neg Hx   . Diabetes Neg Hx    Scheduled Meds: . budesonide (PULMICORT) nebulizer solution  0.5 mg Nebulization BID  . cyanocobalamin  500 mcg Oral Daily  . dronabinol  2.5 mg Oral BID AC  . enoxaparin (LOVENOX) injection  40 mg Subcutaneous Q24H  . ipratropium  2 spray Each Nare TID  . ipratropium-albuterol  3 mL Nebulization Q6H  . memantine  5 mg Oral BID  . methylPREDNISolone (SOLU-MEDROL) injection  40 mg Intravenous Q12H  . mirtazapine  15 mg Oral QHS  . multivitamin with minerals  1 tablet Oral Daily  . polyethylene glycol  17 g Oral Daily  . polyvinyl alcohol  1 drop Both Eyes BID  . sodium chloride flush  3 mL Intravenous Q12H  . tamsulosin  0.4 mg Oral Daily   Continuous Infusions: . sodium chloride    . sodium chloride 75 mL/hr at 05/06/17 1031  . ceFEPime (MAXIPIME) IV Stopped (05/05/17 2135)   PRN Meds:.sodium chloride, acetaminophen **OR** acetaminophen, benzonatate, guaiFENesin, ipratropium-albuterol, lactulose, ondansetron **OR** ondansetron (ZOFRAN) IV, sodium chloride flush Allergies  Allergen Reactions  . Tetracycline Other (See  Comments)    Sick to stomach  . Tetracyclines & Related Other (See Comments)    Patient not sure it's been years since he's taken med   Review of Systems  Unable to perform ROS: Dementia    Physical Exam  Constitutional: He appears cachectic. He has a sickly appearance.  Cardiovascular: Tachycardia present.   Pulmonary/Chest: No accessory muscle usage. Tachypnea noted. He is in respiratory distress. He has decreased breath sounds. He has rhonchi.  Congested cough but not able to expectorate   Abdominal: Normal appearance.  Neurological: He is alert. He is disoriented.    Vital Signs: BP (!) 82/44 (BP Location: Left Arm)   Pulse Marland Kitchen)  107   Temp 98.1 F (36.7 C) (Axillary)   Resp 20   Ht 5\' 10"  (1.778 m)   Wt 64 kg (141 lb 1.5 oz)   SpO2 98%   BMI 20.24 kg/m  Pain Assessment: Faces   Pain Score: 0-No pain   SpO2: SpO2: 98 % O2 Device:SpO2: 98 % O2 Flow Rate: .O2 Flow Rate (L/min): 2 L/min  IO: Intake/output summary:  Intake/Output Summary (Last 24 hours) at 05/06/17 1417 Last data filed at 05/06/17 0800  Gross per 24 hour  Intake               50 ml  Output              100 ml  Net              -50 ml    LBM: Last BM Date: 05/05/17 Baseline Weight: Weight: 64 kg (141 lb 1.5 oz) Most recent weight: Weight: 64 kg (141 lb 1.5 oz)     Palliative Assessment/Data: 20%   Flowsheet Rows     Most Recent Value  Intake Tab  Referral Department  Critical care  Unit at Time of Referral  ICU  Palliative Care Primary Diagnosis  Cardiac  Date Notified  05/05/17  Palliative Care Type  New Palliative care  Reason for referral  Clarify Goals of Care  Date of Admission  05/08/2017  # of days IP prior to Palliative referral  1  Clinical Assessment  Psychosocial & Spiritual Assessment  Palliative Care Outcomes      Time Total: 14min  Greater than 50%  of this time was spent counseling and coordinating care related to the above assessment and plan.  Signed by: Vinie Sill, NP Palliative Medicine Team Pager # [phone] (M-F 8a-5p) Team Phone # [phone] (Nights/Weekends)

## 2017-05-06 NOTE — Progress Notes (Signed)
Dr. Anselm Jungling paged and notified of hypotension and patient not maintaining map greater than 55.  Patient also with decreased urine output.  Palliative team paged and notified of consult to discuss goals of treatment. Thoracentesis postponed at this time due to hypotension.

## 2017-05-06 NOTE — Telephone Encounter (Signed)
PT WAS SEEN AT ED  [facility] - Client [facility] [place] [facility] Patient Name: TAELYN NEMES Gender: Male DOB: 01-23-1925 Age: 83 Y 1 M 18 D Return Phone Number: City/State/Zip: [place] Client [place] Night - Client Client Site [place] Physician Viviana Simpler - MD Who Is Calling Patient / Member / Family / Caregiver Call Type Triage / Clinical Caller Name Clarene Critchley Relationship To Patient Provider Return Phone Number Please choose phone number Chief Complaint BREATHING - shortness of breath or sounds breathless Reason for Call Symptomatic / Request for [place] [phone]) from [facility]. Resident has fever (current 100.4), delusional and in some respiratory distress. He has a fairly recent h/o pneumonia. Wife does not want him sent to ER. She is requesting Rocephin x3 days for treatment. Caller states that patient has O2 requirements that are unusual for him. [place] Not Listed Transport non emergency to ED Nurse Assessment Nurse: Venetia Maxon, RN, Manuela Schwartz Date/Time (Eastern Time): 05/14/2017 6:48:22 PM Confirm and document reason for call. If symptomatic, describe symptoms. ---Clarene Critchley [phone]) from [facility]. Resident has fever (current 100.4 orally), delusional and in some respiratory distress. He has a fairly recent h/o pneumonia. Wife does not want him sent to ER. She is requesting Rocephin x3 days for treatment. Caller states that patient has O2 requirements that are unusual for him. Does the PT have any chronic conditions? (i.e. diabetes, asthma, etc.) ---Yes List chronic conditions. ---dementia hx of pneumonia HTN, rapid weight loss in recent months stomach cancer hx Guidelines Guideline Title Affirmed Question Breathing Difficulty [1] MODERATE difficulty  breathing (e.g., speaks in phrases, SOB even at rest, pulse 100-120) AND [2] NEW-onset or WORSE than normal Disp. Time Eilene Ghazi Time) Disposition Final User 05/08/2017 6:51:18 PM Go to ED Now Yes Venetia Maxon, RN, [facility] - ED PLEASE NOTE:

## 2017-05-06 NOTE — Telephone Encounter (Signed)
He was seen in the ER. Dr Silvio Pate has a note in his box already about the ER visit

## 2017-05-06 NOTE — Progress Notes (Signed)
[place] [facility] [place] NAME: Corey Clark    MR#:  [id number]  DATE OF BIRTH:  17-Mar-1925  SUBJECTIVE:  CHIEF COMPLAINT:   Chief Complaint  Patient presents with  . Cough     Recently treated for pneumonia and lives in a nursing home, sent back with worsening respiratory failure. Noted to have some pleural effusion more on the right side. He is lethargic and have Alzheimer's so not able to give a review of system. Have some hypotension and worsening in renal func today.  REVIEW OF SYSTEMS:   Terminal dementia so not able to give a review of system.  ROS  DRUG ALLERGIES:   Allergies  Allergen Reactions  . Tetracycline Other (See Comments)    Sick to stomach  . Tetracyclines & Related Other (See Comments)    Patient not sure it's been years since he's taken med    VITALS:  Blood pressure (!) 88/44, pulse 96, temperature 98.3 F (36.8 C), temperature source Oral, resp. rate 20, height 5\' 10"  (1.778 m), weight 64 kg (141 lb 1.5 oz), SpO2 98 %.  PHYSICAL EXAMINATION:  GENERAL:  83 y.o.-year-old patient lying in the bed with no acute distress.  EYES: Pupils equal, round, reactive to light and accommodation. No scleral icterus. Extraocular muscles intact.  HEENT: Head atraumatic, normocephalic. Oropharynx and nasopharynx clear.  NECK:  Supple, no jugular venous distention. No thyroid enlargement, no tenderness.  LUNGS: Decreased breath sounds bilaterally, no wheezing, some crepitation. No use of accessory muscles of respiration.  CARDIOVASCULAR: S1, S2 normal. No murmurs, rubs, or gallops.  ABDOMEN: Soft, nontender, nondistended. Bowel sounds present. No organomegaly or mass.  EXTREMITIES: No pedal edema, cyanosis, or clubbing.  NEUROLOGIC: Cranial nerves II through XII are intact. Muscle strength 3-4/5 in all extremities. Sensation intact. Gait not checked.  PSYCHIATRIC: The patient is alert and oriented x 0.  SKIN: No obvious rash,  lesion, or ulcer.   Physical Exam LABORATORY PANEL:   CBC  Recent Labs Lab 05/06/17 0738  WBC 12.3*  HGB 12.8*  HCT 38.6*  PLT 160   ------------------------------------------------------------------------------------------------------------------  Chemistries   Recent Labs Lab 05/06/2017 1957 05/06/17 0738  NA 140 139  K 3.5 4.0  CL 103 103  CO2 30 26  GLUCOSE 116* 146*  BUN 26* 39*  CREATININE 1.01 1.38*  CALCIUM 8.3* 8.5*  AST 52*  --   ALT 24  --   ALKPHOS 118  --   BILITOT 1.0  --    ------------------------------------------------------------------------------------------------------------------  Cardiac Enzymes  Recent Labs Lab 05/05/17 1116 05/05/17 1735  TROPONINI 0.11* 0.06*   ------------------------------------------------------------------------------------------------------------------  RADIOLOGY:  Dg Chest 1 View  Result Date: 05/05/2017 CLINICAL DATA:  Hypotension.  Worsening oxygen saturation. EXAM: CHEST 1 VIEW COMPARISON:  05/08/2017 FINDINGS: Radiography appears quite similar. There is a large right effusion with subtotal collapse of the right lung. There is a small effusion on the left with mild left base volume loss. Abnormal infiltrate in both upper lobes persists. IMPRESSION: No significant radiographic change. Effusions and volume loss right worse than left. Upper lobe infiltrates. Electronically Signed   By: Nelson Chimes M.D.   On: 05/05/2017 17:49   Ct Chest W Contrast  Result Date: 05/05/2017 CLINICAL DATA:  Cough and fever EXAM: CT CHEST WITH CONTRAST TECHNIQUE: Multidetector CT imaging of the chest was performed during intravenous contrast administration. CONTRAST:  12mL ISOVUE-300 IOPAMIDOL (ISOVUE-300) INJECTION 61% COMPARISON:  04/22/2017, 01/14/2017, 04/26/2014 FINDINGS: Cardiovascular: Non aneurysmal  aorta. Aortic atherosclerosis. Coronary artery calcification. Heart size within normal limits. No large pericardial effusion is  seen. Mediastinum/Nodes: Mild mediastinal adenopathy, a right pretracheal lymph node measures 15 mm. Midline trachea. No thyroid mass. Mild fluid and air distention of the esophagus. Abrupt termination of the distal right bronchus in the right bronchus. Lungs/Pleura: Mild emphysematous disease. Moderate right-sided pleural effusion and trace left pleural effusion. Multifocal consolidations within the bilateral upper lobes, right middle lobe, and right lower lobe. Partial consolidation in the left lower lobe. Nodular focus of airspace disease in the lingula. No pneumothorax. Upper Abdomen: Postsurgical changes of the stomach. High density foci in the right upper quadrant may reflect large calcified gallstones. This is incompletely visualized. Musculoskeletal: Degenerative changes. Moderate compression fracture of T12 with estimated 40% loss of height anteriorly. Some gas in the vertebral body. IMPRESSION: 1. Moderate right-sided pleural effusion and trace left pleural effusion. Multifocal consolidations within the bilateral lungs, suspicious for multifocal pneumonia. Imaging follow-up to resolution recommended to exclude underlying neoplasm. Abrupt termination of the distal right bronchus with apparent fluid or debris in the distal bronchus. 2. Mild mediastinal adenopathy, likely reactive. 3. Partially visualized density in the right upper quadrant of the abdomen, likely gallstones. 4. Moderate compression fracture of T12, progressed since radiograph April 2018. 5. Mild mediastinal adenopathy, probably reactive Aortic Atherosclerosis (ICD10-I70.0) and Emphysema (ICD10-J43.9). Electronically Signed   By: Donavan Foil M.D.   On: 05/05/2017 00:26   Dg Chest Port 1 View  Result Date: 05/03/2017 CLINICAL DATA:  Wet cough EXAM: PORTABLE CHEST 1 VIEW COMPARISON:  01/14/2017 FINDINGS: Bilateral pleural effusions, small on the left and moderate on the right. Consolidation at the right lung base. Stable cardiomediastinal  silhouette. Increased opacity within the left lung apex. Aortic atherosclerosis. No pneumothorax. IMPRESSION: 1. Bilateral pleural effusions, small on the left, moderate on the right and appears increased on the right side. 2. Right basilar consolidation persists and may reflect atelectasis or pneumonia. Increased density at the left greater than right lung apex, cannot exclude additional infiltrates. Electronically Signed   By: Donavan Foil M.D.   On: 05/01/2017 20:24    ASSESSMENT AND PLAN:   Active Problems:   HCAP (healthcare-associated pneumonia)   Multifocal pneumonia   Goals of care, counseling/discussion   Palliative care encounter   Pressure injury of skin  * HCAP   Pleural effusion   Acute respiratory failure with hypoxia on admission    Broad-spectrum antibiotics   Ultrasound-guided thoracentesis is planned . Required BiPAP support of admission, currently on nasal cannula oxygen.   After discussing with palliative care, held thoracentesis today.     Also called SLP eval.  *   Elevated troponin    Demand Ischemia secondary to infection, echocardiogram is done.  * Possible acute diastolic CHF   Checked echocardiogram, was on Lasix.   Now due to dehydration, hold lasix.  * ac renal insufficiency   Start gentle hydration.    All the records are reviewed and case discussed with Care Management/Social Workerr. Management plans discussed with the patient, family and they are in agreement.  CODE STATUS: DO NOT RESUSCITATE   TOTAL TIME TAKING CARE OF THIS PATIENT: 35  minutes.     POSSIBLE D/C IN 2-3 DAYS, DEPENDING ON CLINICAL CONDITION.   Vaughan Basta M.D on 05/06/2017   Between 7am to 6pm - Pager - [phone]  After 6pm go to [email or web address] - password [email or web address] [facility]  Office  [phone]  CC: Primary care  physician; Venia Carbon, MD  Note: This dictation was prepared with Dragon dictation along with smaller phrase  technology. Any transcriptional errors that result from this process are unintentional.

## 2017-05-07 LAB — BASIC METABOLIC PANEL
Anion gap: 7 (ref 5–15)
BUN: 54 mg/dL — ABNORMAL HIGH (ref 6–20)
CALCIUM: 8.4 mg/dL — AB (ref 8.9–10.3)
CO2: 28 mmol/L (ref 22–32)
CREATININE: 1.46 mg/dL — AB (ref 0.61–1.24)
Chloride: 107 mmol/L (ref 101–111)
GFR calc Af Amer: 47 mL/min — ABNORMAL LOW (ref >60)
GFR, EST NON AFRICAN AMERICAN: 41 mL/min — AB (ref >60)
Glucose, Bld: 132 mg/dL — ABNORMAL HIGH (ref 65–99)
Potassium: 4.5 mmol/L (ref 3.5–5.1)
SODIUM: 142 mmol/L (ref 135–145)

## 2017-05-07 NOTE — Progress Notes (Signed)
[place] at [place] NAME: Corey Clark    MR#:  [id number]  DATE OF BIRTH:  Feb 20, 1925  SUBJECTIVE:  CHIEF COMPLAINT:   Chief Complaint  Patient presents with  . Cough     Recently treated for pneumonia and lives in a nursing home, sent back with worsening respiratory failure. Noted to have some pleural effusion more on the right side. He is lethargic and have Alzheimer's so not able to give a review of system. Have some hypotension and worsening in renal func. When I saw him around 9 in the morning, he was more lethargic.  REVIEW OF SYSTEMS:   Terminal dementia so not able to give a review of system.  ROS  DRUG ALLERGIES:   Allergies  Allergen Reactions  . Tetracycline Other (See Comments)    Sick to stomach  . Tetracyclines & Related Other (See Comments)    Patient not sure it's been years since he's taken med    VITALS:  Blood pressure 114/63, pulse 97, temperature 97.6 F (36.4 C), temperature source Oral, resp. rate 16, height 5\' 10"  (1.778 m), weight 64 kg (141 lb 1.5 oz), SpO2 99 %.  PHYSICAL EXAMINATION:  GENERAL:  83 y.o.-year-old cachectic patient lying in the bed with no acute distress.  EYES: Pupils equal, round, reactive to light and accommodation. No scleral icterus. Extraocular muscles intact.  HEENT: Head atraumatic, normocephalic. Oropharynx and nasopharynx clear.  NECK:  Supple, no jugular venous distention. No thyroid enlargement, no tenderness.  LUNGS: Decreased breath sounds bilaterally, no wheezing, some crepitation. No use of accessory muscles of respiration. Requiring supplemental oxygen. CARDIOVASCULAR: S1, S2 normal. No murmurs, rubs, or gallops.  ABDOMEN: Soft, nontender, nondistended. Bowel sounds present. No organomegaly or mass.  EXTREMITIES: No pedal edema, cyanosis, or clubbing.  NEUROLOGIC: Cranial nerves not checked because patient is lethargic, he moves his limbs to painful stimuli but does  not follow commands. PSYCHIATRIC: The patient is drowsy. SKIN: No obvious rash, lesion, or ulcer.   Physical Exam LABORATORY PANEL:   CBC  Recent Labs Lab 05/06/17 0738  WBC 12.3*  HGB 12.8*  HCT 38.6*  PLT 160   ------------------------------------------------------------------------------------------------------------------  Chemistries   Recent Labs Lab 04/16/2017 1957  05/07/17 0433  NA 140  < > 142  K 3.5  < > 4.5  CL 103  < > 107  CO2 30  < > 28  GLUCOSE 116*  < > 132*  BUN 26*  < > 54*  CREATININE 1.01  < > 1.46*  CALCIUM 8.3*  < > 8.4*  AST 52*  --   --   ALT 24  --   --   ALKPHOS 118  --   --   BILITOT 1.0  --   --   < > = values in this interval not displayed. ------------------------------------------------------------------------------------------------------------------  Cardiac Enzymes  Recent Labs Lab 05/05/17 1116 05/05/17 1735  TROPONINI 0.11* 0.06*   ------------------------------------------------------------------------------------------------------------------  RADIOLOGY:  Dg Chest 1 View  Result Date: 05/05/2017 CLINICAL DATA:  Hypotension.  Worsening oxygen saturation. EXAM: CHEST 1 VIEW COMPARISON:  04/27/2017 FINDINGS: Radiography appears quite similar. There is a large right effusion with subtotal collapse of the right lung. There is a small effusion on the left with mild left base volume loss. Abnormal infiltrate in both upper lobes persists. IMPRESSION: No significant radiographic change. Effusions and volume loss right worse than left. Upper lobe infiltrates. Electronically Signed   By: Jan Fireman.D.  On: 05/05/2017 17:49    ASSESSMENT AND PLAN:   Active Problems:   HCAP (healthcare-associated pneumonia)   Multifocal pneumonia   Goals of care, counseling/discussion   Palliative care encounter   Pressure injury of skin  * HCAP   Pleural effusion   Acute respiratory failure with hypoxia on admission    Broad-spectrum  antibiotics   Ultrasound-guided thoracentesis is planned . Required BiPAP support of admission, currently on nasal cannula oxygen.   After discussing with palliative care, held thoracentesis.     Also called SLP eval.    Patient appears having very poor prognosis, drowsy and in respiratory failure with cachexia, and suspected dysphagia with pleural effusion. Most likely he may not survive this sickness episode. Explained to his wife and she was very emotional yesterday but understanding this. Today there is still a meeting pending in the presence of more family and friend support for the wife and to help her make the decision with help of palliative care.  *   Elevated troponin    Demand Ischemia secondary to infection, echocardiogram is done.  * Possible acute diastolic CHF   Checked echocardiogram, was on Lasix.   Now due to dehydration, hold lasix.  * ac renal insufficiency   Start gentle hydration.   All the records are reviewed and case discussed with Care Management/Social Workerr. Management plans discussed with the patient, family and they are in agreement.  CODE STATUS: DO NOT RESUSCITATE   TOTAL TIME TAKING CARE OF THIS PATIENT: 35  minutes.    POSSIBLE D/C IN 2-3 DAYS, DEPENDING ON CLINICAL CONDITION.   Vaughan Basta M.D on 05/07/2017   Between 7am to 6pm - Pager - [phone]  After 6pm go to [email or web address] - password [email or web address] [facility]  Office  [phone]  CC: Primary care physician; Venia Carbon, MD  Note: This dictation was prepared with Dragon dictation along with smaller phrase technology. Any transcriptional errors that result from this process are unintentional.

## 2017-05-07 NOTE — Progress Notes (Signed)
Daily Progress Note   Patient Name: Corey Clark       Date: 05/07/2017 DOB: 22-Jun-1925  Age: 83 y.o. MRN#: [id number] Attending Physician: Vaughan Basta, * Primary Care Physician: Venia Carbon, MD Admit Date: 04/23/2017  Reason for Consultation/Follow-up: Establishing goals of care  Subjective: Corey Clark has been more lethargic today but wakes up when I visit.   Length of Stay: 2  Current Medications: Scheduled Meds:  . budesonide (PULMICORT) nebulizer solution  0.5 mg Nebulization BID  . cyanocobalamin  500 mcg Oral Daily  . dronabinol  2.5 mg Oral BID AC  . enoxaparin (LOVENOX) injection  40 mg Subcutaneous Q24H  . ipratropium  2 spray Each Nare TID  . ipratropium-albuterol  3 mL Nebulization Q6H  . memantine  5 mg Oral BID  . methylPREDNISolone (SOLU-MEDROL) injection  40 mg Intravenous Q12H  . mirtazapine  15 mg Oral QHS  . multivitamin with minerals  1 tablet Oral Daily  . polyethylene glycol  17 g Oral Daily  . polyvinyl alcohol  1 drop Both Eyes BID  . sodium chloride flush  3 mL Intravenous Q12H  . tamsulosin  0.4 mg Oral Daily    Continuous Infusions: . sodium chloride    . ceFEPime (MAXIPIME) IV Stopped (05/06/17 2137)    PRN Meds: sodium chloride, acetaminophen **OR** acetaminophen, benzonatate, guaiFENesin, ipratropium-albuterol, lactulose, ondansetron **OR** ondansetron (ZOFRAN) IV, sodium chloride flush  Physical Exam    Constitutional: He appears cachectic. He has a sickly appearance.  Cardiovascular: Tachycardia present.   Pulmonary/Chest: No accessory muscle usage. Tachypnea noted. He is in respiratory distress. He has decreased breath sounds. He has rhonchi.  Congested cough but not able to expectorate   Abdominal: Normal  appearance.  Neurological: He is alert. He is disoriented.   Vital Signs: BP 114/63 (BP Location: Left Arm)   Pulse 97   Temp 97.6 F (36.4 C) (Oral)   Resp 16   Ht _0  (1.778 m)   Wt 64 kg (141 lb 1.5 oz)   SpO2 99%   BMI 20.24 kg/m  SpO2: SpO2: 99 % O2 Device: O2 Device: Nasal Cannula O2 Flow Rate: O2 Flow Rate (L/min): 2 L/min  Intake/output summary:  Intake/Output Summary (Last 24 hours) at 05/07/17 1646 Last data filed at 05/07/17 1330  Gross per 24  hour  Intake             1020 ml  Output                0 ml  Net             1020 ml   LBM: Last BM Date: 05/06/17 Baseline Weight: Weight: 64 kg (141 lb 1.5 oz) Most recent weight: Weight: 64 kg (141 lb 1.5 oz)       Palliative Assessment/Data: 20%    Flowsheet Rows     Most Recent Value  Intake Tab  Referral Department  Critical care  Unit at Time of Referral  ICU  Palliative Care Primary Diagnosis  Cardiac  Date Notified  05/05/17  Palliative Care Type  New Palliative care  Reason for referral  Clarify Goals of Care  Date of Admission  05/02/2017  # of days IP prior to Palliative referral  1  Clinical Assessment  Psychosocial & Spiritual Assessment  Palliative Care Outcomes      Patient Active Problem List   Diagnosis Date Noted  . Pressure injury of skin 05/06/2017  . Multifocal pneumonia   . Goals of care, counseling/discussion   . Palliative care encounter   . HCAP (healthcare-associated pneumonia) 05/05/2017  . Lobar pneumonia (HCC) 01/14/2017  . Mood disorder (HCC) 10/23/2016  . Mild malnutrition (HCC) 07/27/2016  . Alzheimer's dementia, late onset 08/31/2015  . 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 Cough 07/25/2015  . Advance directive discussed with patient 01/17/2015  . Non-allergic rhinitis 06/29/2014  . Abnormality of lung on CXR 06/29/2014  . Trigger finger 04/07/2014  . [facility] DJD(carpometacarpal degenerative joint disease), localized primary 07/13/2013  . H/O malignant neoplasm of skin  06/29/2013  . BPH (benign prostatic hypertrophy)   . Routine general medical examination at a health care facility 05/30/2012  . Other constipation 11/26/2011  . GERD (gastroesophageal reflux disease)   . Osteoarthritis, multiple sites   . Neuropathy due to drug [facility])     Palliative Care Assessment & Plan   HPI: 83 y.o. male  with past medical history of Alzheimer's dementia (currently resides memory care unit), prostate hypertrophy, GERD, osteoarthritis, squamous cell carcinoma, stomach cancer (1999 treated with surgery and chemo) admitted on 05/01/2017 with altered mental status and continued cough - off antibiotics for pneumonia treatment for 3 weeks. Patient status very tenuous and I was called by RN to assist with education and [place] conversation d/t advanced dementia complicated by acute pneumonia/infection.   Assessment: I met again today with Corey Clark. She is happy that he is alert and he actually ate a little bit for her - she says this is more than he has eaten in a couple weeks. We again discussed plans and she wishes to optimize his care and then proceed with transition back to [facility] with hospice in place. I spent some time talking with her and providing therapeutic listening and emotional support.   Recommendations/Plan:  Dyspnea: If medical interventions optimized (nebs, oxygen) would recommend low dose fentanyl 12.5-25 mcg IV prn for comfort.   Agitation: Could consider haldol 1 mg IV prn.    Code Status:  DNR  Prognosis:   < 6 months, likely weeks or less  Discharge Planning:  Skilled Nursing Facility with Hospice  Thank you for allowing the Palliative Medicine Team to assist in the care of this patient.   Total Time Prolonged Time Billed  no       Greater than  50%  of this time was spent counseling and coordinating care related to the above assessment and plan.  Corey Sill, NP Palliative Medicine Team Pager # [phone] (M-F 8a-5p) Team  Phone # [phone] (Nights/Weekends)

## 2017-05-07 NOTE — Progress Notes (Signed)
CH made a follow up visit with pt, family member was not in the room at the time, and the pt was asleep. Ch did not want to wake up the pt, so CH offered a silent prayer and left. [place] to do a follow up visit with pt as needed.   05/07/17 1000  Clinical Encounter Type  Visited With Patient  Visit Type Follow-up;Psychological support;Other (Comment)  Referral From Chaplain  Consult/Referral To [place] (Comment)

## 2017-05-07 NOTE — Progress Notes (Signed)
SLP Cancellation Note  Patient Details Name: Corey Clark MRN: [id number] DOB: [birth year]   Cancelled treatment:       Reason Eval/Treat Not Completed: Fatigue/lethargy limiting ability to participate. Attempted swallowing treatment/follow-up on toleration of diet. Pt was somnolent with wife in room. Pt's wife reported pt took single bite of breakfast and lunch tray was in room and un-touched. Provided verbal and tactile stimulation to awaken pt. He opened his eyes briefly and ST was able to perform oral care. Pt unable to stay alert and despite further verbal and tactile stimulation, unable to arouse pt to safely attempt PO intake. Nsg also reports that pt has been poorly arousable. Palliative care is consulted. Will continue to follow pt and attempt PO's if appropriate. Spoke with wife about not attempt any food/liquid unless pt is fully alert.    [place] 05/07/2017, 1:41 PM

## 2017-05-08 LAB — BASIC METABOLIC PANEL
ANION GAP: 5 (ref 5–15)
BUN: 63 mg/dL — ABNORMAL HIGH (ref 6–20)
CALCIUM: 8.6 mg/dL — AB (ref 8.9–10.3)
CO2: 28 mmol/L (ref 22–32)
CREATININE: 1.17 mg/dL (ref 0.61–1.24)
Chloride: 110 mmol/L (ref 101–111)
GFR, EST NON AFRICAN AMERICAN: 53 mL/min — AB (ref >60)
Glucose, Bld: 141 mg/dL — ABNORMAL HIGH (ref 65–99)
Potassium: 4.8 mmol/L (ref 3.5–5.1)
SODIUM: 143 mmol/L (ref 135–145)

## 2017-05-08 LAB — PROCALCITONIN: PROCALCITONIN: 0.57 ng/mL

## 2017-05-08 MED ORDER — HALOPERIDOL LACTATE 2 MG/ML PO CONC: 0.5000 mg | ORAL | Status: DC | PRN

## 2017-05-08 MED ORDER — MORPHINE SULFATE (PF) 2 MG/ML IV SOLN: 2.0000 mg | INTRAVENOUS | Status: DC | PRN

## 2017-05-08 MED ORDER — HALOPERIDOL LACTATE 5 MG/ML IJ SOLN: 1.0000 mg | INTRAMUSCULAR | Status: DC | PRN

## 2017-05-08 MED ORDER — MORPHINE SULFATE (PF) 2 MG/ML IV SOLN: 1.0000 mg | INTRAVENOUS | Status: DC | PRN

## 2017-05-08 MED ORDER — GLYCOPYRROLATE 0.2 MG/ML IJ SOLN
0.2000 mg | INTRAMUSCULAR | Status: DC
  Administered 2017-05-09: 0.2 mg via INTRAVENOUS
  Filled 2017-05-08 (×9): qty 1

## 2017-05-08 MED ORDER — HALOPERIDOL LACTATE 5 MG/ML IJ SOLN: 0.5000 mg | INTRAMUSCULAR | Status: DC | PRN

## 2017-05-08 MED ORDER — HALOPERIDOL 0.5 MG PO TABS: 0.5000 mg | ORAL_TABLET | ORAL | Status: DC | PRN

## 2017-05-08 MED ORDER — FUROSEMIDE 10 MG/ML IJ SOLN
20.0000 mg | Freq: Once | INTRAMUSCULAR | Status: AC
  Administered 2017-05-08: 20 mg via INTRAVENOUS
  Filled 2017-05-08: qty 4

## 2017-05-08 MED ORDER — SCOPOLAMINE 1 MG/3DAYS TD PT72
1.0000 | MEDICATED_PATCH | TRANSDERMAL | Status: DC
  Administered 2017-05-08: 1.5 mg via TRANSDERMAL
  Filled 2017-05-08: qty 1

## 2017-05-08 MED ORDER — BIOTENE DRY MOUTH MT LIQD: 15.0000 mL | OROMUCOSAL | Status: DC | PRN

## 2017-05-08 NOTE — Progress Notes (Signed)
Patient is non-responsive. On non-rebreather. Incont of urine. LBM 7/24. NSL x2 in place. Palliative care in place. Actively dying. Repositioned when family allows. Mittens removed. Breathing is calm and shallow. Wife and friends at bedside all day. Comfort cart at bedside. Emotional support given.

## 2017-05-08 NOTE — Progress Notes (Signed)
Pt non responsive, resting comfortably, breathing calm and shallow on non rebreather.

## 2017-05-08 NOTE — Progress Notes (Signed)
Per palliative note today patient is now on comfort care and "anticipate likely hospital death." Clinical Education officer, museum ([place]) updated Marine scientist at [place]. CSW will continue to follow and assist as needed.   [employer], LCSW [phone]

## 2017-05-08 NOTE — Progress Notes (Signed)
Daily Progress Note   Patient Name: Corey Clark       Date: 05/08/2017 DOB: 1925-01-06  Age: 83 y.o. MRN#: [id number] Attending Physician: Vaughan Basta, * Primary Care Physician: Venia Carbon, MD Admit Date: 04/30/2017  Reason for Consultation/Follow-up: Establishing goals of care  Subjective: Mr. Biehn has declined overnight. Appears to be actively dying.    Length of Stay: 3  Current Medications: Scheduled Meds:  . budesonide (PULMICORT) nebulizer solution  0.5 mg Nebulization BID  . glycopyrrolate  0.2 mg Intravenous Q4H  . ipratropium  2 spray Each Nare TID  . ipratropium-albuterol  3 mL Nebulization Q6H  . polyvinyl alcohol  1 drop Both Eyes BID  . scopolamine  1 patch Transdermal Q72H  . sodium chloride flush  3 mL Intravenous Q12H    Continuous Infusions: . sodium chloride      PRN Meds: sodium chloride, antiseptic oral rinse, [DISCONTINUED] haloperidol **OR** [DISCONTINUED] haloperidol **OR** haloperidol lactate, ipratropium-albuterol, morphine injection, [DISCONTINUED] ondansetron **OR** ondansetron (ZOFRAN) IV, sodium chloride flush  Physical Exam    Constitutional: He appears cachectic. He has a sickly appearance.  Cardiovascular: Tachycardia present.   Pulmonary/Chest: No accessory muscle usage. Tachypnea noted. He is in respiratory distress. He has decreased breath sounds. He has rhonchi, shallow, agonal Abdominal: Normal appearance.  Neurological: He is unresponsive.   Vital Signs: BP 127/65 (BP Location: Right Arm)   Pulse (!) 101   Temp 97.7 F (36.5 C) (Oral)   Resp 18   Ht 5' 10"  (1.778 m)   Wt 64 kg (141 lb 1.5 oz)   SpO2 (!) 87%   BMI 20.24 kg/m  SpO2: SpO2: (!) 87 % O2 Device: O2 Device: Nasal Cannula O2 Flow Rate: O2  Flow Rate (L/min): 4 L/min  Intake/output summary:   Intake/Output Summary (Last 24 hours) at 05/08/17 1240 Last data filed at 05/07/17 2116  Gross per 24 hour  Intake              100 ml  Output                0 ml  Net              100 ml   LBM: Last BM Date: 05/07/17 Baseline Weight: Weight: 64 kg (141 lb 1.5 oz) Most recent weight:  Weight: 64 kg (141 lb 1.5 oz)       Palliative Assessment/Data: 10%    Flowsheet Rows     Most Recent Value  Intake Tab  Referral Department  Critical care  Unit at Time of Referral  ICU  Palliative Care Primary Diagnosis  Cardiac  Date Notified  05/05/17  Palliative Care Type  New Palliative care  Reason for referral  Clarify Goals of Care  Date of Admission  05/06/2017  # of days IP prior to Palliative referral  1  Clinical Assessment  Psychosocial & Spiritual Assessment  Palliative Care Outcomes      Patient Active Problem List   Diagnosis Date Noted  . Pressure injury of skin 05/06/2017  . Multifocal pneumonia   . Goals of care, counseling/discussion   . Palliative care encounter   . HCAP (healthcare-associated pneumonia) 05/05/2017  . Lobar pneumonia ([place]) 01/14/2017  . Mood disorder ([place]) 10/23/2016  . Mild malnutrition ([place]) 07/27/2016  . Alzheimer's dementia, late onset 08/31/2015  . 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 Cough 07/25/2015  . Advance directive discussed with patient 01/17/2015  . Non-allergic rhinitis 06/29/2014  . Abnormality of lung on CXR 06/29/2014  . Trigger finger 04/07/2014  . [facility] DJD(carpometacarpal degenerative joint disease), localized primary 07/13/2013  . H/O malignant neoplasm of skin 06/29/2013  . BPH (benign prostatic hypertrophy)   . Routine general medical examination at a health care facility 05/30/2012  . Other constipation 11/26/2011  . GERD (gastroesophageal reflux disease)   . Osteoarthritis, multiple sites   . Neuropathy due to drug [facility])     Palliative Care Assessment & Plan    HPI: 83 y.o. male  with past medical history of Alzheimer's dementia (currently resides memory care unit), prostate hypertrophy, GERD, osteoarthritis, squamous cell carcinoma, stomach cancer (1999 treated with surgery and chemo) admitted on 04/19/2017 with altered mental status and continued cough - off antibiotics for pneumonia treatment for 3 weeks. Patient status very tenuous and I was called by RN to assist with education and [place] conversation d/t advanced dementia complicated by acute pneumonia/infection.   Assessment: I met at Mr. Disano' bedside along with wife and their close friend Lesleigh Noe. Lesleigh Noe was great assistance and support to [place] as I explained that he is at EOL. We discussed that he has declined and breathing is worse and not expected to improve. [name] is tearful and understands but was just hoping they had a little more time to get him back to memory care with hospice. She tearfully tells me that she had a friend to die and she says he looked just as her husband does now. She understands that he can die at any time and that I do not recommend that he be moved from the hospital at this time. Will proceed with full comfort care. Emotional support and therapeutic listening provided.  Recommendations/Plan:  Dyspnea/pain: morphine 2 mg IV every hour prn - may increase dosage/frequency as needed to achieve comfort.   Agitation: Haldol 0.5 mg every 4 hours prn.   Secretions: Robinul 0.2 mg IV every 4 hours prn. Scopolamine patch was added this morning.   Code Status:  DNR  Prognosis:   Hours to days  Discharge Planning:  Anticipate hospital death  Thank you for allowing the Palliative Medicine Team to assist in the care of this patient.   Total Time 59mn Prolonged Time Billed  no       Greater than 50%  of this time was spent counseling and coordinating  care related to the above assessment and plan.  Vinie Sill, NP Palliative Medicine Team Pager # [phone]  (M-F 8a-5p) Team Phone # [phone] (Nights/Weekends)

## 2017-05-08 NOTE — Plan of Care (Signed)
Problem: Safety: Goal: Ability to remain free from injury will improve Outcome: Progressing Bed alarm in place.  Problem: Pain Managment: Goal: General experience of comfort will improve Outcome: Progressing No complaints of pain this shift.

## 2017-05-08 NOTE — Progress Notes (Signed)
Speech Language Pathology Cancellation  Patient Details Name: Corey Clark MRN: [id number] DOB: 1925/07/15 Today's Date: 05/08/2017 Time:  -     SLP attempted trials of current recommended diet and upgraded trials, however pt to lethargic and slp unable to arouse at this time for safe PO intake. SLP will follow up in 1-2 days for diet toleration and assess for potential upgrade.    [place] Sauber 05/08/2017, 11:13 AM

## 2017-05-08 NOTE — Progress Notes (Signed)
Pt resting comfortably no signs of pain or agitation. Breathing calm and shallow.

## 2017-05-08 NOTE — Progress Notes (Signed)
[facility] [place] NAME: Corey Clark    MR#:  [id number]  DATE OF BIRTH:  12-Jun-1925  SUBJECTIVE:  CHIEF COMPLAINT:   Chief Complaint  Patient presents with  . Cough     Recently treated for pneumonia and lives in a nursing home, sent back with worsening respiratory failure. Noted to have some pleural effusion more on the right side. He is lethargic and have Alzheimer's so not able to give a review of system. Have some hypotension and worsening in renal func. When I saw him around 9 in the morning, he was more lethargic.   Have more gurgling sound today.  REVIEW OF SYSTEMS:   Terminal dementia so not able to give a review of system.  ROS  DRUG ALLERGIES:   Allergies  Allergen Reactions  . Tetracycline Other (See Comments)    Sick to stomach  . Tetracyclines & Related Other (See Comments)    Patient not sure it's been years since he's taken med    VITALS:  Blood pressure 127/65, pulse (!) 101, temperature 97.7 F (36.5 C), temperature source Oral, resp. rate 18, height 5\' 10"  (1.778 m), weight 64 kg (141 lb 1.5 oz), SpO2 (!) 87 %.  PHYSICAL EXAMINATION:  GENERAL:  83 y.o.-year-old cachectic patient lying in the bed with no acute distress.  EYES: Pupils equal, round, reactive to light and accommodation. No scleral icterus. Extraocular muscles intact.  HEENT: Head atraumatic, normocephalic. Oropharynx and nasopharynx clear.  NECK:  Supple, no jugular venous distention. No thyroid enlargement, no tenderness.  LUNGS: Decreased breath sounds bilaterally, no wheezing, some crepitation. No use of accessory muscles of respiration. Requiring supplemental oxygen. Have gurgling sounds of breathing. CARDIOVASCULAR: S1, S2 normal. No murmurs, rubs, or gallops.  ABDOMEN: Soft, nontender, nondistended. Bowel sounds present. No organomegaly or mass.  EXTREMITIES: No pedal edema, cyanosis, or clubbing.  NEUROLOGIC: Cranial nerves not  checked because patient is lethargic, he moves his limbs to painful stimuli but does not follow commands. PSYCHIATRIC: The patient is drowsy. SKIN: No obvious rash, lesion, or ulcer.   Physical Exam LABORATORY PANEL:   CBC  Recent Labs Lab 05/06/17 0738  WBC 12.3*  HGB 12.8*  HCT 38.6*  PLT 160   ------------------------------------------------------------------------------------------------------------------  Chemistries   Recent Labs Lab 04/30/2017 1957  05/08/17 0805  NA 140  < > 143  K 3.5  < > 4.8  CL 103  < > 110  CO2 30  < > 28  GLUCOSE 116*  < > 141*  BUN 26*  < > 63*  CREATININE 1.01  < > 1.17  CALCIUM 8.3*  < > 8.6*  AST 52*  --   --   ALT 24  --   --   ALKPHOS 118  --   --   BILITOT 1.0  --   --   < > = values in this interval not displayed. ------------------------------------------------------------------------------------------------------------------  Cardiac Enzymes  Recent Labs Lab 05/05/17 1116 05/05/17 1735  TROPONINI 0.11* 0.06*   ------------------------------------------------------------------------------------------------------------------  RADIOLOGY:  No results found.  ASSESSMENT AND PLAN:   Active Problems:   HCAP (healthcare-associated pneumonia)   Multifocal pneumonia   Goals of care, counseling/discussion   Palliative care encounter   Pressure injury of skin  * HCAP   Pleural effusion   Acute respiratory failure with hypoxia on admission    Broad-spectrum antibiotics   Ultrasound-guided thoracentesis is planned . Required BiPAP support of admission, currently on nasal cannula  oxygen.   After discussing with palliative care, held thoracentesis.     Also called SLP eval.    Patient appears having very poor prognosis, drowsy and in respiratory failure with cachexia, and suspected dysphagia with pleural effusion. Most likely he may not survive this sickness episode. Explained to his wife and she was very emotional  yesterday but understanding this. Today there is still a meeting pending in the presence of more family and friend support for the wife and to help her make the decision with help of palliative care.  *   Elevated troponin    Demand Ischemia secondary to infection, echocardiogram is done.  * Possible acute diastolic CHF   Checked echocardiogram, was on Lasix.   Now due to dehydration, hold lasix.  * ac renal insufficiency   Start gentle hydration.   Improved some.  Today after meeting with palliative care- family agreed on complete comfort care.   All the records are reviewed and case discussed with Care Management/Social Workerr. Management plans discussed with the patient, family and they are in agreement.  CODE STATUS: DO NOT RESUSCITATE   TOTAL TIME TAKING CARE OF THIS PATIENT: 30  minutes.    POSSIBLE D/C IN 2-3 DAYS, DEPENDING ON CLINICAL CONDITION.   Vaughan Basta M.D on 05/08/2017   Between 7am to 6pm - Pager - [phone]  After 6pm go to [email or web address] - password [email or web address] [place] [facility]  Office  [phone]  CC: Primary care physician; Venia Carbon, MD  Note: This dictation was prepared with Dragon dictation along with smaller phrase technology. Any transcriptional errors that result from this process are unintentional.

## 2017-05-08 NOTE — Progress Notes (Signed)
Pt alert this shift. Pt was awake and able to take night time medications. Pt taking naps in between care. Mouth swabbed through the night. Pt remaining on 2L of oxygen. Lungs sounds with Rhonchi throughout.

## 2017-05-08 NOTE — Progress Notes (Signed)
Palliative:  Full note to follow. I have discussed with wife and friend, Lesleigh Noe, at bedside. Mr. Axel is now full comfort care and we anticipate likely hospital death.   Vinie Sill, NP Palliative Medicine Team Pager # [phone] (M-F 8a-5p) Team Phone # [phone] (Nights/Weekends)

## 2017-05-09 LAB — CULTURE, BLOOD (ROUTINE X 2)
Culture: NO GROWTH
Culture: NO GROWTH
Special Requests: ADEQUATE
Special Requests: ADEQUATE

## 2017-05-15 NOTE — Discharge Summary (Signed)
Date of death- 2017-05-21  Cause of death- healthcare associated Pneumonia  Other contributory factors    Ac renal insufficiency    Ac diastolic CHF  Hospital course    * HCAP   Pleural effusion   Acute respiratory failure with hypoxia on admission    Broad-spectrum antibiotics   Ultrasound-guided thoracentesis is planned . Required BiPAP support of admission, currently on nasal cannula oxygen.   After discussing with palliative care, held thoracentesis.     Also called SLP eval.    Patient appears having very poor prognosis, drowsy and in respiratory failure with cachexia, and suspected dysphagia with pleural effusion. Most likely he may not survive this sickness episode. Explained to his wife and she was very emotional yesterday but understanding this. Today there is still a meeting pending in the presence of more family and friend support for the wife and to help her make the decision with help of palliative care.  Family decided for comfort care. He stayed overnight on comfort measures and died.  *   Elevated troponin    Demand Ischemia secondary to infection, echocardiogram is done.  * Possible acute diastolic CHF   Checked echocardiogram, was on Lasix.   Now due to dehydration, hold lasix.  * ac renal insufficiency   Start gentle hydration.   Improved some.

## 2017-05-15 NOTE — Progress Notes (Signed)
                                                                                                                                                                                                         Daily Progress Note   Patient Name: Corey Clark       Date: 04/25/2017 DOB: 10/09/1925  Age: 83 y.o. MRN#: [id number] Attending Physician: Clark, Corey, * Primary Care Physician: Clark, Corey I, MD Admit Date: 05/13/2017  Reason for Consultation/Follow-up: Establishing goals of care  Subjective: Corey Clark has passed as I go in the room to visit. Wife and friend at bedside. Time of death confirmed with Brandi, RN at 0920.   Length of Stay: 4  Current Medications: Scheduled Meds:  . glycopyrrolate  0.2 mg Intravenous Q4H  . polyvinyl alcohol  1 drop Both Eyes BID  . scopolamine  1 patch Transdermal Q72H  . sodium chloride flush  3 mL Intravenous Q12H    Continuous Infusions: . sodium chloride      PRN Meds: sodium chloride, antiseptic oral rinse, [DISCONTINUED] haloperidol **OR** [DISCONTINUED] haloperidol **OR** haloperidol lactate, ipratropium-albuterol, morphine injection, [DISCONTINUED] ondansetron **OR** ondansetron (ZOFRAN) IV, sodium chloride flush  Physical Exam    No pulse, respiration, heart beat  Vital Signs: BP (!) 104/53 (BP Location: Right Arm)   Pulse 100   Temp 97.6 F (36.4 C) (Axillary)   Resp 20   Ht 5' 10" (1.778 m)   Wt 64 kg (141 lb 1.5 oz)   SpO2 90%   BMI 20.24 kg/m  SpO2: SpO2: 90 % O2 Device: O2 Device: NRB O2 Flow Rate: O2 Flow Rate (L/min): 4 L/min  Intake/output summary:  No intake or output data in the 24 hours ending 05/02/2017 1219 LBM: Last BM Date: 05/07/17 Baseline Weight: Weight: 64 kg (141 lb 1.5 oz) Most recent weight: Weight: 64 kg (141 lb 1.5 oz)       Palliative Assessment/Data: 0%    Flowsheet Rows     Most Recent Value  Intake Tab  Referral Department  Critical care  Unit at Time of Referral   ICU  Palliative Care Primary Diagnosis  Cardiac  Date Notified  05/05/17  Palliative Care Type  New Palliative care  Reason for referral  Clarify Goals of Care  Date of Admission  04/29/2017  # of days IP prior to Palliative referral  1  Clinical Assessment  Psychosocial & Spiritual Assessment  Palliative Care Outcomes      Patient Active Problem List   Diagnosis Date Noted  . Pressure   injury of skin 05/06/2017  . Multifocal pneumonia   . Goals of care, counseling/discussion   . Palliative care encounter   . HCAP (healthcare-associated pneumonia) 05/05/2017  . Lobar pneumonia (HCC) 01/14/2017  . Mood disorder (HCC) 10/23/2016  . Mild malnutrition (HCC) 07/27/2016  . Alzheimer's dementia, late onset 08/31/2015  . 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 Cough 07/25/2015  . Advance directive discussed with patient 01/17/2015  . Non-allergic rhinitis 06/29/2014  . Abnormality of lung on CXR 06/29/2014  . Trigger finger 04/07/2014  . CMC DJD(carpometacarpal degenerative joint disease), localized primary 07/13/2013  . H/O malignant neoplasm of skin 06/29/2013  . BPH (benign prostatic hypertrophy)   . Routine general medical examination at a health care facility 05/30/2012  . Other constipation 11/26/2011  . GERD (gastroesophageal reflux disease)   . Osteoarthritis, multiple sites   . 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 male  with past medical history of Alzheimer's dementia (currently resides memory care unit), prostate hypertrophy, GERD, osteoarthritis, squamous cell carcinoma, stomach cancer (1999 treated with surgery and chemo) admitted on 05/08/2017 with altered mental status and continued cough - off antibiotics for pneumonia treatment for 3 weeks. Patient status very tenuous and I was called by RN to assist with education and GOC conversation d/t advanced dementia complicated by acute pneumonia/infection.   Assessment: I met at [name]' bedside along with wife and their close friend Corey Clark. They are aware that he has died. I spent some time helping to keep his wife calm (she has panic attacks and I helped her through some calming breathing exercises). We had a life review and reassured her that she made good choices for him and helped to give him the best QOL possible. I helped to get off his wedding ring (I gave to wife Corey Clark and she placed this in her purse) and also cut a lock of his beard for her to keep. RN assisting with transition of body to [facility] for anatomical donation as they have previously arranged. Emotional support provided. Corey Clark was left calm and in the care of her friend to take her home.   Recommendations/Plan:  Dyspnea/pain: morphine 2 mg IV every hour prn - may increase dosage/frequency as needed to achieve comfort.   Agitation: Haldol 0.5 mg every 4 hours prn.   Secretions: Robinul 0.2 mg IV every 4 hours prn. Scopolamine patch was added this morning.   Code Status:  DNR  Prognosis:   Hours to days  Discharge Planning:  hospital death   Thank you for allowing the Palliative Medicine Team to assist in the care of this patient.   Total Time 0920-1100 100min Prolonged Time Billed  yes       Greater than 50%  of this time was spent counseling and coordinating care related to the above assessment and plan.   , NP Palliative Medicine Team Pager # [phone] (M-F 8a-5p) Team Phone # [phone] (Nights/Weekends)      

## 2017-05-15 NOTE — Progress Notes (Signed)
Patient without pulse. Elmo Putt, NP with Palliative at bedside to confirm. Wife and friend at bedside.

## 2017-05-15 NOTE — Progress Notes (Signed)
Pt with calm and shallow breathing this morning. Some rhonchi in the upper airway this morning.. No signs of pain or agitation. Mouth mosterised  for comfort. Pt repositioned through the night.

## 2017-05-15 NOTE — Progress Notes (Signed)
Pt resting comfortably. No signs of pain or agitation at this time. Breathing remaining calm and shallow.

## 2017-05-15 NOTE — Progress Notes (Signed)
[employer] [facility] [facility] was notified of TOD of [id number]. Ref # [id number]. Patient is not a donor. Spoke with [employer].

## 2017-05-15 DEATH — deceased

## 2017-06-10 ENCOUNTER — Ambulatory Visit: Payer: Medicare Other | Admitting: Podiatry
